# Patient Record
Sex: Male | Born: 1975 | Race: White | Hispanic: No | Marital: Married | Smoking: Current every day smoker
Health system: Southern US, Community
[De-identification: ages and names within clinical notes are randomized; demographics above are authoritative.]

## PROBLEM LIST (undated history)

## (undated) DIAGNOSIS — M519 Unspecified thoracic, thoracolumbar and lumbosacral intervertebral disc disorder: Secondary | ICD-10-CM

## (undated) DIAGNOSIS — M549 Dorsalgia, unspecified: Secondary | ICD-10-CM

## (undated) HISTORY — PX: FINGER SURGERY: SHX640

---

## 2016-09-05 ENCOUNTER — Emergency Department
Admission: EM | Admit: 2016-09-05 | Discharge: 2016-09-05 | Disposition: A | Discharge status: Home / Self Care | Payer: Self-pay | Attending: Emergency Medicine | Admitting: Emergency Medicine

## 2016-09-05 ENCOUNTER — Encounter: Payer: Self-pay | Admitting: Emergency Medicine

## 2016-09-05 DIAGNOSIS — Y939 Activity, unspecified: Secondary | ICD-10-CM | POA: Insufficient documentation

## 2016-09-05 DIAGNOSIS — S39012A Strain of muscle, fascia and tendon of lower back, initial encounter: Secondary | ICD-10-CM | POA: Insufficient documentation

## 2016-09-05 DIAGNOSIS — Y929 Unspecified place or not applicable: Secondary | ICD-10-CM | POA: Insufficient documentation

## 2016-09-05 DIAGNOSIS — Y999 Unspecified external cause status: Secondary | ICD-10-CM | POA: Insufficient documentation

## 2016-09-05 DIAGNOSIS — F17210 Nicotine dependence, cigarettes, uncomplicated: Secondary | ICD-10-CM | POA: Insufficient documentation

## 2016-09-05 DIAGNOSIS — X509XXA Other and unspecified overexertion or strenuous movements or postures, initial encounter: Secondary | ICD-10-CM | POA: Insufficient documentation

## 2016-09-05 HISTORY — DX: Dorsalgia, unspecified: M54.9

## 2016-09-05 MED ORDER — TIZANIDINE HCL 4 MG PO TABS: 4.0000 mg | ORAL_TABLET | Freq: Three times a day (TID) | ORAL | 0 refills | Status: DC

## 2016-09-05 MED ORDER — PREDNISONE 10 MG (21) PO TBPK: ORAL_TABLET | ORAL | 0 refills | Status: DC

## 2016-09-05 MED ORDER — MELOXICAM 15 MG PO TABS: 15.0000 mg | ORAL_TABLET | Freq: Every day | ORAL | 0 refills | Status: DC

## 2016-09-05 MED ORDER — KETOROLAC TROMETHAMINE 60 MG/2ML IM SOLN
60.0000 mg | Freq: Once | INTRAMUSCULAR | Status: AC
  Administered 2016-09-05: 60 mg via INTRAMUSCULAR
  Filled 2016-09-05: qty 2

## 2016-09-05 MED ORDER — ORPHENADRINE CITRATE 30 MG/ML IJ SOLN
60.0000 mg | Freq: Two times a day (BID) | INTRAMUSCULAR | Status: DC
  Administered 2016-09-05: 60 mg via INTRAMUSCULAR
  Filled 2016-09-05: qty 2

## 2016-09-05 NOTE — ED Notes (Signed)
NAD noted at time of D/C. Pt denies questions or concerns. Pt ambulatory to the lobby at this time.  

## 2016-09-05 NOTE — ED Triage Notes (Signed)
Pt reports chronic back pain x 20 years. States today he was pulling a starter today and started having back pain. Pt states he took ibuprofen and hydrocodone 7.5 around 340. Pt takes hydrocodone daily prescribed for chronic back pain.

## 2016-09-05 NOTE — ED Provider Notes (Signed)
[facility] Emergency Department Provider Note ____________________________________________  Time seen: Approximately 7:54 PM  I have reviewed the triage vital signs and the nursing notes.   HISTORY  Chief Complaint Back Pain    HPI Nathaniel AlkenMark Chen is a 41 y.o. male who presents to the emergency department for management of his back pain. He states that he has had chronic back pain for approximately 15 years. He states that today he pulled on a cord to start a 3 wheeler and "aggravated it." He states that he has a similar episode every few years after straining his back doing something "stupid." He took hishydrocodone and also took an ibuprofen without any relief. He states that in the past, when he has had a similar episode he has received some relief with steroids and Toradol. This episode of back pain is identical to previous episodes. No change in location or quality of pain. He denies radiation into the lower extremities also anesthesia or loss of bowel or bladder control.   Past Medical History:  Diagnosis Date  . Back pain     There are no active problems to display for this patient.   History reviewed. No pertinent surgical history.  Prior to Admission medications   Medication Sig Start Date End Date Taking? Authorizing Provider  meloxicam (MOBIC) 15 MG tablet Take 1 tablet (15 mg total) by mouth daily. 09/05/16   Chinita Pesterari B Pasha Gadison, FNP  predniSONE (STERAPRED UNI-PAK 21 TAB) 10 MG (21) TBPK tablet Take 6 tablets on day 1 Take 5 tablets on day 2 Take 4 tablets on day 3 Take 3 tablets on day 4 Take 2 tablets on day 5 Take 1 tablet on day 6 09/05/16   Aki Burdin B Roma Bierlein, FNP  tiZANidine (ZANAFLEX) 4 MG tablet Take 1 tablet (4 mg total) by mouth 3 (three) times daily. 09/05/16   Chinita Pesterari B Rohn Fritsch, FNP    Allergies Patient has no known allergies.  History reviewed. No pertinent family history.  Social History Social History  Substance Use Topics  .  Smoking status: Current Every Day Smoker    Packs/day: 1.00    Types: Cigarettes  . Smokeless tobacco: Never Used  . Alcohol use No    Review of Systems Constitutional: No recent illness. Cardiovascular: Denies chest pain or palpitations. Respiratory: Denies shortness of breath. Musculoskeletal: Pain in Lower back Skin: Negative for rash, wound, lesion. Neurological: Negative for focal weakness or numbness.  ____________________________________________   PHYSICAL EXAM:  VITAL SIGNS: ED Triage Vitals  Enc Vitals Group     BP 09/05/16 1901 112/63     Pulse Rate 09/05/16 1901 91     Resp --      Temp 09/05/16 1901 98.5 F (36.9 C)     Temp Source 09/05/16 1901 Oral     SpO2 09/05/16 1901 96 %     Weight 09/05/16 1902 140 lb (63.5 kg)     Height 09/05/16 1902 5\' 9"  (1.753 m)     Head Circumference --      Peak Flow --      Pain Score 09/05/16 1902 10     Pain Loc --      Pain Edu? --      Excl. in GC? --     Constitutional: Alert and oriented. Well appearing and in no acute distress. Eyes: Conjunctivae are normal. EOMI. Head: Atraumatic. Neck: No stridor.  Respiratory: Normal respiratory effort.   Musculoskeletal: Negative straight leg raise testing. Observed antalgic ambulation with  steady gait. Neurologic:  Normal speech and language. No gross focal neurologic deficits are appreciated. Speech is normal. No gait instability. Skin:  Skin is warm, dry and intact. Atraumatic. Psychiatric: Mood and affect are normal. Speech and behavior are normal.  ____________________________________________   LABS (all labs ordered are listed, but only abnormal results are displayed)  Labs Reviewed - No data to display ____________________________________________  RADIOLOGY  Not indicated ____________________________________________   PROCEDURES  Procedure(s) performed: None   ____________________________________________   INITIAL IMPRESSION / ASSESSMENT AND PLAN /  ED COURSE     Pertinent labs & imaging results that were available during my care of the patient were reviewed by me and considered in my medical decision making (see chart for details).  42 year old male presenting to the emergency department for treatment of acute on chronic low back pain. While in the emergency department tonight, he received injections of Toradol and Norflex with significant relief. He was discharged home with a prescription of meloxicam, Zanaflex, and a tapered dose of prednisone. He was encouraged to follow up with his pain management provider for symptoms that are not improving over the week or call [facility] to see if he can schedule an appointment. He was advised to return to the emergency department for symptoms that change or worsen if he is unable to see pain management or primary care. ____________________________________________   FINAL CLINICAL IMPRESSION(S) / ED DIAGNOSES  Final diagnoses:  Strain of lumbar region, initial encounter       Chinita Pester, FNP 09/05/16 2208    Emily Filbert, MD 09/06/16 [phone]

## 2017-04-03 ENCOUNTER — Encounter: Payer: Self-pay | Admitting: Emergency Medicine

## 2017-04-03 DIAGNOSIS — M5432 Sciatica, left side: Secondary | ICD-10-CM | POA: Insufficient documentation

## 2017-04-03 DIAGNOSIS — F17210 Nicotine dependence, cigarettes, uncomplicated: Secondary | ICD-10-CM | POA: Insufficient documentation

## 2017-04-03 DIAGNOSIS — M5431 Sciatica, right side: Secondary | ICD-10-CM | POA: Insufficient documentation

## 2017-04-03 DIAGNOSIS — Z79899 Other long term (current) drug therapy: Secondary | ICD-10-CM | POA: Insufficient documentation

## 2017-04-03 NOTE — ED Triage Notes (Signed)
Patient states that he has bulging disc in his back and that he has been having pain since Monday. Patient states that he has been trying to rest his back and take IBU but has not had any improvement. Patient states that he normally gets a steroid shot when he has this back pain.

## 2017-04-04 ENCOUNTER — Emergency Department
Admission: EM | Admit: 2017-04-04 | Discharge: 2017-04-04 | Disposition: A | Discharge status: Home / Self Care | Payer: Self-pay | Attending: Emergency Medicine | Admitting: Emergency Medicine

## 2017-04-04 DIAGNOSIS — M5432 Sciatica, left side: Secondary | ICD-10-CM

## 2017-04-04 DIAGNOSIS — M5431 Sciatica, right side: Secondary | ICD-10-CM

## 2017-04-04 MED ORDER — DEXAMETHASONE SODIUM PHOSPHATE 10 MG/ML IJ SOLN
10.0000 mg | Freq: Once | INTRAMUSCULAR | Status: AC
  Administered 2017-04-04: 10 mg via INTRAVENOUS
  Filled 2017-04-04: qty 1

## 2017-04-04 MED ORDER — KETOROLAC TROMETHAMINE 30 MG/ML IJ SOLN
30.0000 mg | Freq: Once | INTRAMUSCULAR | Status: AC
  Administered 2017-04-04: 30 mg via INTRAVENOUS
  Filled 2017-04-04: qty 1

## 2017-04-04 MED ORDER — DIAZEPAM 5 MG PO TABS
5.0000 mg | ORAL_TABLET | Freq: Once | ORAL | Status: AC
  Administered 2017-04-04: 5 mg via ORAL
  Filled 2017-04-04: qty 1

## 2017-04-04 MED ORDER — PREDNISONE 20 MG PO TABS: 60.0000 mg | ORAL_TABLET | Freq: Every day | ORAL | 0 refills | Status: AC

## 2017-04-04 MED ORDER — CYCLOBENZAPRINE HCL 10 MG PO TABS: 10.0000 mg | ORAL_TABLET | Freq: Three times a day (TID) | ORAL | 0 refills | Status: DC | PRN

## 2017-04-04 NOTE — ED Notes (Signed)
MD Brown at bedside.

## 2017-04-04 NOTE — ED Notes (Signed)
Patient reports hx of buldging disc. Patient c/o lower back pain/weakness. Denies new injury.   Patient reports taking 40 mg ibuprofen at approx 1900 yesterday. Pt reports taking 1/2 tablet of 7.5/325 hydrocodone/tylenol.

## 2017-04-04 NOTE — ED Notes (Signed)
Reviewed d/c instructions, follow-up care, prescriptions with patient. Pt verbalized understanding.  

## 2017-04-04 NOTE — ED Provider Notes (Signed)
[facility] Emergency Department Provider Note   First MD Initiated Contact with Patient 04/04/17 0138     (approximate)  I have reviewed the triage vital signs and the nursing notes.   HISTORY  Chief Complaint Back Pain    HPI Nathaniel Chen is a 41 y.o. male with history of "bulging disc in his lower back" presents with low back pain with onset Monday. Patient denies any known injury. The patient states he is usually able to control this with ibuprofen and rest however despite doing so pain has persisted since Monday with current pain score of 9 out of 10. Patient states that pain radiates down his legs. Patient denies any weakness numbness in the lower extremity. Patient denies any urinary or bowel habit changes.   Past Medical History:  Diagnosis Date  . Back pain     There are no active problems to display for this patient.   Past Surgical History:  Procedure Laterality Date  . FINGER SURGERY      Prior to Admission medications   Medication Sig Start Date End Date Taking? Authorizing Provider  meloxicam (MOBIC) 15 MG tablet Take 1 tablet (15 mg total) by mouth daily. 09/05/16   Triplett, Cari B, FNP  predniSONE (STERAPRED UNI-PAK 21 TAB) 10 MG (21) TBPK tablet Take 6 tablets on day 1 Take 5 tablets on day 2 Take 4 tablets on day 3 Take 3 tablets on day 4 Take 2 tablets on day 5 Take 1 tablet on day 6 09/05/16   Triplett, Cari B, FNP  tiZANidine (ZANAFLEX) 4 MG tablet Take 1 tablet (4 mg total) by mouth 3 (three) times daily. 09/05/16   Kem Boroughs B, FNP    Allergies no known drug allergies No family history on file.  Social History Social History  Substance Use Topics  . Smoking status: Current Every Day Smoker    Packs/day: 1.00    Types: Cigarettes  . Smokeless tobacco: Never Used  . Alcohol use No    Review of Systems Constitutional: No fever/chills Eyes: No visual changes. ENT: No sore throat. Cardiovascular: Denies  chest pain. Respiratory: Denies shortness of breath. Gastrointestinal: No abdominal pain.  No nausea, no vomiting.  No diarrhea.  No constipation. Genitourinary: Negative for dysuria. Musculoskeletal: Negative for neck pain.  positive for back pain. Integumentary: Negative for rash. Neurological: Negative for headaches, focal weakness or numbness.  ____________________________________________   PHYSICAL EXAM:  VITAL SIGNS: ED Triage Vitals  Enc Vitals Group     BP 04/03/17 2237 (!) 133/58     Pulse Rate 04/03/17 2237 72     Resp 04/03/17 2237 18     Temp 04/03/17 2237 98 F (36.7 C)     Temp Source 04/03/17 2237 Oral     SpO2 04/03/17 2237 98 %     Weight 04/03/17 2237 63.5 kg (140 lb)     Height 04/03/17 2237 1.727 m ( )     Head Circumference --      Peak Flow --      Pain Score 04/03/17 2236 10     Pain Loc --      Pain Edu? --      Excl. in GC? --     Constitutional: Alert and oriented. apparent discomfort Eyes: Conjunctivae are normal.  Head: Atraumatic. Mouth/Throat: Mucous membranes are moist.  Oropharynx non-erythematous. Neck: No stridor.   Cardiovascular: Normal rate, regular rhythm. Good peripheral circulation. Grossly normal heart sounds. Respiratory: Normal respiratory  effort.  No retractions. Lungs CTAB. Gastrointestinal: Soft and nontender. No distention.  Musculoskeletal: No lower extremity tenderness nor edema. No gross deformities of extremities. Neurologic:  Normal speech and language. No gross focal neurologic deficits are appreciated.  Skin:  Skin is warm, dry and intact. No rash noted. Psychiatric: Mood and affect are normal. Speech and behavior are normal.  ____________________________________________     Procedures   ____________________________________________   INITIAL IMPRESSION / ASSESSMENT AND PLAN / ED COURSE  Pertinent labs & imaging results that were available during my care of the patient were reviewed by me and considered  in my medical decision making (see chart for details).   41 year old male presenting with above stated history and physical exam consistent with sciatica. Patient given Decadron Toradol and Valium with marked improvement of pain. Patient be referred to Dr. Marcell Barlow neurosurgeon for further outpatient evaluation for sciatica.     ____________________________________________  FINAL CLINICAL IMPRESSION(S) / ED DIAGNOSES  Final diagnoses:  Bilateral sciatica     MEDICATIONS GIVEN DURING THIS VISIT:  Medications  dexamethasone (DECADRON) injection 10 mg (not administered)  ketorolac (TORADOL) 30 MG/ML injection 30 mg (not administered)  diazepam (VALIUM) tablet 5 mg (not administered)     NEW OUTPATIENT MEDICATIONS STARTED DURING THIS VISIT:  New Prescriptions   No medications on file    Modified Medications   No medications on file    Discontinued Medications   No medications on file     Note:  This document was prepared using Dragon voice recognition software and may include unintentional dictation errors.    [name], MD 04/04/17 [phone]

## 2017-05-19 ENCOUNTER — Emergency Department
Admission: EM | Admit: 2017-05-19 | Discharge: 2017-05-19 | Disposition: A | Discharge status: Home / Self Care | Payer: Self-pay | Attending: Emergency Medicine | Admitting: Emergency Medicine

## 2017-05-19 ENCOUNTER — Encounter: Payer: Self-pay | Admitting: *Deleted

## 2017-05-19 ENCOUNTER — Emergency Department: Payer: Self-pay

## 2017-05-19 DIAGNOSIS — S335XXA Sprain of ligaments of lumbar spine, initial encounter: Secondary | ICD-10-CM

## 2017-05-19 DIAGNOSIS — X500XXA Overexertion from strenuous movement or load, initial encounter: Secondary | ICD-10-CM | POA: Insufficient documentation

## 2017-05-19 DIAGNOSIS — Z791 Long term (current) use of non-steroidal anti-inflammatories (NSAID): Secondary | ICD-10-CM | POA: Insufficient documentation

## 2017-05-19 DIAGNOSIS — M62830 Muscle spasm of back: Secondary | ICD-10-CM | POA: Insufficient documentation

## 2017-05-19 DIAGNOSIS — Y929 Unspecified place or not applicable: Secondary | ICD-10-CM | POA: Insufficient documentation

## 2017-05-19 DIAGNOSIS — Y999 Unspecified external cause status: Secondary | ICD-10-CM | POA: Insufficient documentation

## 2017-05-19 DIAGNOSIS — Y939 Activity, unspecified: Secondary | ICD-10-CM | POA: Insufficient documentation

## 2017-05-19 DIAGNOSIS — M5431 Sciatica, right side: Secondary | ICD-10-CM | POA: Insufficient documentation

## 2017-05-19 DIAGNOSIS — M5432 Sciatica, left side: Secondary | ICD-10-CM | POA: Insufficient documentation

## 2017-05-19 DIAGNOSIS — S339XXA Sprain of unspecified parts of lumbar spine and pelvis, initial encounter: Secondary | ICD-10-CM | POA: Insufficient documentation

## 2017-05-19 DIAGNOSIS — F17210 Nicotine dependence, cigarettes, uncomplicated: Secondary | ICD-10-CM | POA: Insufficient documentation

## 2017-05-19 DIAGNOSIS — Z79899 Other long term (current) drug therapy: Secondary | ICD-10-CM | POA: Insufficient documentation

## 2017-05-19 MED ORDER — PREDNISONE 10 MG (21) PO TBPK: ORAL_TABLET | ORAL | 0 refills | Status: DC

## 2017-05-19 MED ORDER — ORPHENADRINE CITRATE 30 MG/ML IJ SOLN
60.0000 mg | Freq: Once | INTRAMUSCULAR | Status: AC
  Administered 2017-05-19: 60 mg via INTRAVENOUS
  Filled 2017-05-19: qty 2

## 2017-05-19 MED ORDER — ORPHENADRINE CITRATE ER 100 MG PO TB12: 100.0000 mg | ORAL_TABLET | Freq: Two times a day (BID) | ORAL | 0 refills | Status: DC | PRN

## 2017-05-19 MED ORDER — DEXAMETHASONE SODIUM PHOSPHATE 10 MG/ML IJ SOLN
20.0000 mg | Freq: Once | INTRAMUSCULAR | Status: AC
  Administered 2017-05-19: 20 mg via INTRAVENOUS
  Filled 2017-05-19: qty 2

## 2017-05-19 NOTE — ED Notes (Addendum)
Pt states he has significant back history, slipped disc, ruptured disc was here in August for same thing.Pt states pain is relieved when standing still, but other times it is shooting pain down leg.  Pt is standing at bedside with family member. Awaiting EDP.

## 2017-05-19 NOTE — Discharge Instructions (Signed)
Take medication as prescribed. Return to emergency department if symptoms worsen and follow-up with PCP as needed.   °

## 2017-05-19 NOTE — ED Provider Notes (Signed)
[facility] Emergency Department Provider Note   ____________________________________________   I have reviewed the triage vital signs and the nursing notes.   HISTORY  Chief Complaint Back Pain    HPI Nathaniel Chen is a 41 y.o. male presents to the emergency department with lumbar pain, radiating pain down bilateral lower extremities and lumbar muscle spasms since lifting furniture 1 days ago. Patient describes pain as constant, catching and aching. He denies sensation changes or numbness and tingling of the lower extremities. Patient reports history of "bulging disc in his lower back" with multiple episodes of treatments for sciatica symptoms over the years. Patient reports this episode occurred with less intense aggravating activity which concerns him. He also notes his "legs feel slightly weaker" Patient denies bowl/bladder dysfunction or saddle anesthesia. He also states he never really resolved the last episode when he received treatment ~8 weeks ago. Patient did not follow up with neurosurgery d/t lack of insurance coverage. Patient denies fever, chills, headache, vision changes, chest pain, chest tightness, shortness of breath, abdominal pain, nausea and vomiting.  Past Medical History:  Diagnosis Date  . Back pain     There are no active problems to display for this patient.   Past Surgical History:  Procedure Laterality Date  . FINGER SURGERY      Prior to Admission medications   Medication Sig Start Date End Date Taking? Authorizing Provider  cyclobenzaprine (FLEXERIL) 10 MG tablet Take 1 tablet (10 mg total) by mouth 3 (three) times daily as needed. 04/04/17   Darci CurrentBrown, [place] N, MD  meloxicam (MOBIC) 15 MG tablet Take 1 tablet (15 mg total) by mouth daily. 09/05/16   Triplett, Rulon Eisenmengerari B, FNP  orphenadrine (NORFLEX) 100 MG tablet Take 1 tablet (100 mg total) 2 (two) times daily as needed by mouth for muscle spasms. 05/19/17 05/19/18  Tamantha Saline M,  PA-C  predniSONE (STERAPRED UNI-PAK 21 TAB) 10 MG (21) TBPK tablet Take 6 tablets on day 1. Take 5 tablets on day 2. Take 4 tablets on day 3. Take 3 tablets on day 4. Take 2 tablets on day 5. Take 1 tablets on day 6. 05/19/17   Shawan Corella M, PA-C  tiZANidine (ZANAFLEX) 4 MG tablet Take 1 tablet (4 mg total) by mouth 3 (three) times daily. 09/05/16   Chinita Pesterriplett, Cari B, FNP    Allergies Patient has no known allergies.  History reviewed. No pertinent family history.  Social History Social History   Tobacco Use  . Smoking status: Current Every Day Smoker    Packs/day: 1.00    Types: Cigarettes  . Smokeless tobacco: Never Used  Substance Use Topics  . Alcohol use: No  . Drug use: No    Review of Systems Constitutional: No fever/chills Eyes: No visual changes. Cardiovascular: Denies chest pain. Respiratory: Denies shortness of breath. Gastrointestinal: No abdominal pain.  No nausea, no vomiting.  No diarrhea.  No constipation. Genitourinary: Negative for dysuria. Musculoskeletal: Positive for back pain and radicular pain.  Integumentary: Negative for rash. Neurological: Negative for headaches, focal weakness or numbness. ____________________________________________   PHYSICAL EXAM:  VITAL SIGNS: ED Triage Vitals  Enc Vitals Group     BP 05/19/17 1631 109/68     Pulse Rate 05/19/17 1631 100     Resp 05/19/17 1631 18     Temp 05/19/17 1631 98.2 F (36.8 C)     Temp Source 05/19/17 1631 Oral     SpO2 05/19/17 1631 99 %  Weight 05/19/17 1631 145 lb (65.8 kg)     Height 05/19/17 1631 5\' 8"  (1.727 m)     Head Circumference --      Peak Flow --      Pain Score 05/19/17 1640 10     Pain Loc --      Pain Edu? --      Excl. in GC? --     Constitutional: Alert and oriented. apparent discomfort Eyes: Conjunctivae are normal.  Head: Atraumatic. Mouth/Throat: Mucous membranes are moist.  Neck: No stridor.   Cardiovascular: Normal rate, regular rhythm. Good peripheral  circulation. Grossly normal heart sounds. Respiratory: Normal respiratory effort.  No retractions. Lungs CTAB. Gastrointestinal: Soft and nontender. No distention.  Musculoskeletal: Lumbar spine ROM intact without spinous process tenderness or deformity. Lumbar parasinal and gluteal muscle tenderness with spasms. (+) slump test bilaterally. Intact ROM, strength and sensation of the bilateral lower extremities.  Neurologic:  Normal speech and language. No gross focal neurologic deficits are appreciated.  Skin:  Skin is warm, dry and intact. No rash noted. Psychiatric: Mood and affect are normal. Speech and behavior are normal. ____________________________________________   LABS (all labs ordered are listed, but only abnormal results are displayed)  Labs Reviewed - No data to display ____________________________________________  EKG none ____________________________________________  RADIOLOGY DG lumbar complete FINDINGS: Right greater than left degenerative facet arthropathy at L5-S1. 3 mm degenerative retrolisthesis at L5-S1. No fracture or subluxation identified.  IMPRESSION: 1. 3 mm degenerative retrolisthesis at L5-S1 associated with mild degenerative facet arthropathy. ____________________________________________   PROCEDURES  Procedure(s) performed: no    Critical Care performed: no ____________________________________________   INITIAL IMPRESSION / ASSESSMENT AND PLAN / ED COURSE  Pertinent labs & imaging results that were available during my care of the patient were reviewed by me and considered in my medical decision making (see chart for details).  Patient presents to emergency department with lumbar back pain after lifting furniture earlier today. History, physical exam findings and imaging are reassuring symptoms are consistent with lumbar strain with exacerbation of sciatica. Patient noted improvement of symptoms following decadron and norflex given during  the course of care in the emergency department. Patient will be prescribed prednisone taper and norflex as needed for muscle spasms. Patient advised to follow up with PCP as needed or return to the emergency department if symptoms return or worsen. Patient informed of clinical course, understand medical decision-making process, and agree with plan. Patient informed of clinical course, understand medical decision-making process, and agree with plan.  ____________________________________________   FINAL CLINICAL IMPRESSION(S) / ED DIAGNOSES  Final diagnoses:  Lumbar back sprain, initial encounter  Bilateral sciatica  Muscle spasm of back       NEW MEDICATIONS STARTED DURING THIS VISIT:  This SmartLink is deprecated. Use AVSMEDLIST instead to display the medication list for a patient.   Note:  This document was prepared using Dragon voice recognition software and may include unintentional dictation errors.    Clois ComberLittle, Locke Barrell M, PA-C 05/19/17 1839    Minna AntisPaduchowski, Kevin, MD 05/20/17 1339

## 2017-05-19 NOTE — ED Triage Notes (Signed)
States he was moving furniture today and states his chronic back pain is flaring up

## 2017-05-26 ENCOUNTER — Encounter: Payer: Self-pay | Admitting: Intensive Care

## 2017-05-26 ENCOUNTER — Emergency Department
Admission: EM | Admit: 2017-05-26 | Discharge: 2017-05-26 | Disposition: A | Discharge status: Home / Self Care | Payer: Medicaid Other | Attending: Emergency Medicine | Admitting: Emergency Medicine

## 2017-05-26 ENCOUNTER — Emergency Department: Payer: Medicaid Other

## 2017-05-26 DIAGNOSIS — M545 Low back pain: Secondary | ICD-10-CM | POA: Diagnosis present

## 2017-05-26 DIAGNOSIS — M5442 Lumbago with sciatica, left side: Secondary | ICD-10-CM | POA: Insufficient documentation

## 2017-05-26 DIAGNOSIS — Z79899 Other long term (current) drug therapy: Secondary | ICD-10-CM | POA: Diagnosis not present

## 2017-05-26 DIAGNOSIS — F17210 Nicotine dependence, cigarettes, uncomplicated: Secondary | ICD-10-CM | POA: Insufficient documentation

## 2017-05-26 DIAGNOSIS — M5441 Lumbago with sciatica, right side: Secondary | ICD-10-CM | POA: Insufficient documentation

## 2017-05-26 HISTORY — DX: Unspecified thoracic, thoracolumbar and lumbosacral intervertebral disc disorder: M51.9

## 2017-05-26 MED ORDER — METHYLPREDNISOLONE SODIUM SUCC 125 MG IJ SOLR
125.0000 mg | Freq: Once | INTRAMUSCULAR | Status: AC
  Administered 2017-05-26: 125 mg via INTRAVENOUS
  Filled 2017-05-26: qty 2

## 2017-05-26 MED ORDER — PREDNISONE 50 MG PO TABS: ORAL_TABLET | ORAL | 0 refills | Status: DC

## 2017-05-26 NOTE — ED Notes (Signed)
Patient presents to the ED with chronic lower back pain.  Patient states he often has flare ups that are usually relieved with rest and medication but states this last one has been going on for two weeks without improvement and are interfering with patient's job.

## 2017-05-26 NOTE — ED Notes (Signed)
Patient taken to MRI

## 2017-05-26 NOTE — ED Provider Notes (Signed)
[facility] Emergency Department Provider Note  ____________________________________________  Time seen: Approximately 4:45 PM  I have reviewed the triage vital signs and the nursing notes.   HISTORY  Chief Complaint Back Pain    HPI Nathaniel Chen is a 41 y.o. male presents to the emergency department with low back pain with bilateral lower extremity radiculopathy and new onset weakness.  Patient was seen at [facility] on 05/19/2017 and given Decadron with prednisone prescribed at discharge.  Patient reports no significant improvement in his symptoms.  Patient is concerned about leg weakness.  He denies saddle anesthesia or incontinence.  He denies falls or instances of trauma.  Patient is seeking MRI.  Patient reports that he is unable to seek care with a neurosurgeon at this time due to a lack of insurance.  He denies chest pain, chest tightness, shortness of breath, nausea, vomiting abdominal pain.   Past Medical History:  Diagnosis Date  . Back pain   . Ruptured disk     There are no active problems to display for this patient.   Past Surgical History:  Procedure Laterality Date  . FINGER SURGERY      Prior to Admission medications   Medication Sig Start Date End Date Taking? Authorizing Provider  cyclobenzaprine (FLEXERIL) 10 MG tablet Take 1 tablet (10 mg total) by mouth 3 (three) times daily as needed. 04/04/17   [name], MD  meloxicam (MOBIC) 15 MG tablet Take 1 tablet (15 mg total) by mouth daily. 09/05/16   Triplett, Rulon Eisenmenger B, FNP  orphenadrine (NORFLEX) 100 MG tablet Take 1 tablet (100 mg total) 2 (two) times daily as needed by mouth for muscle spasms. 05/19/17 05/19/18  Little, Traci M, PA-C  predniSONE (DELTASONE) 50 MG tablet Take one tablet daily for the next five days. 05/26/17   Orvil Feil, PA-C  tiZANidine (ZANAFLEX) 4 MG tablet Take 1 tablet (4 mg total) by mouth 3 (three) times daily. 09/05/16   Chinita Pester, FNP    Allergies Patient has no known allergies.  History reviewed. No pertinent family history.  Social History Social History   Tobacco Use  . Smoking status: Current Every Day Smoker    Packs/day: 1.00    Types: Cigarettes  . Smokeless tobacco: Never Used  Substance Use Topics  . Alcohol use: No  . Drug use: No     Review of Systems  Constitutional: No fever/chills Eyes: No visual changes. No discharge ENT: No upper respiratory complaints. Cardiovascular: no chest pain. Respiratory: no cough. No SOB. Gastrointestinal: No abdominal pain.  No nausea, no vomiting.  No diarrhea.  No constipation. Genitourinary: Negative for dysuria. No hematuria Musculoskeletal: Patient has low back pain Skin: Negative for rash, abrasions, lacerations, ecchymosis. Neurological: Negative for headaches, focal weakness or numbness.   ____________________________________________   PHYSICAL EXAM:  VITAL SIGNS: ED Triage Vitals  Enc Vitals Group     BP 05/26/17 1621 131/69     Pulse Rate 05/26/17 1621 83     Resp 05/26/17 1621 16     Temp 05/26/17 1621 (!) 97.5 F (36.4 C)     Temp Source 05/26/17 1621 Oral     SpO2 05/26/17 1621 95 %     Weight 05/26/17 1620 145 lb (65.8 kg)     Height 05/26/17 1620 5\' 8"  (1.727 m)     Head Circumference --      Peak Flow --      Pain Score 05/26/17  1620 10     Pain Loc --      Pain Edu? --      Excl. in GC? --      Constitutional: Patient is standing. Eyes: Conjunctivae are normal. PERRL. EOMI. Head: Atraumatic. Cardiovascular: Normal rate, regular rhythm. Normal S1 and S2.  Good peripheral circulation. Respiratory: Normal respiratory effort without tachypnea or retractions. Lungs CTAB. Good air entry to the bases with no decreased or absent breath sounds. Musculoskeletal: Patient has positive straight leg raise test bilaterally.  Patient's left leg is shaking with straight leg raise test.  Paraspinal muscle tenderness elicited  with palpation of lumbar paraspinal muscles.  Palpable dorsalis pedis pulse bilaterally and symmetrically. Neurologic:  Normal speech and language. No gross focal neurologic deficits are appreciated.  Skin:  Skin is warm, dry and intact. No rash noted. Psychiatric: Mood and affect are normal. Speech and behavior are normal. Patient exhibits appropriate insight and judgement.   ____________________________________________   LABS (all labs ordered are listed, but only abnormal results are displayed)  Labs Reviewed - No data to display ____________________________________________  EKG   ____________________________________________  RADIOLOGY Geraldo PitterI, Jaclyn M Woods, personally viewed and evaluated these images  as part of my medical decision making, as well as reviewing the written report by the radiologist.    Mr Lumbar Spine Wo Contrast  Result Date: 05/26/2017 CLINICAL DATA:  Low back and bilateral leg pain 1 week after lifting injury. EXAM: MRI LUMBAR SPINE WITHOUT CONTRAST TECHNIQUE: Multiplanar, multisequence MR imaging of the lumbar spine was performed. No intravenous contrast was administered. COMPARISON:  Lumbar spine radiographs 05/19/2017 FINDINGS: Segmentation: Conventional anatomy assumed, with the last open disc space designated L5-S1. Alignment:  Normal. Vertebrae: No worrisome osseous lesion, acute fracture or pars defect. The visualized sacroiliac joints appear unremarkable. Conus medullaris: Extends to the T12 level and is not well visualized. Paraspinal and other soft tissues: No significant paraspinal findings. Disc levels: No significant disc space findings from T12-L1 through L2-3. L3-4: Mild disc desiccation and bulging asymmetric laterally to the left. Mild bilateral facet hypertrophy. There is minimal narrowing of the left lateral recess. The foramina are patent. L4-5: Mild loss of disc height with annular disc bulging. Mild facet and ligamentous hypertrophy. There is mild  narrowing of the lateral recesses. The foramina are patent. L5-S1: Disc height and hydration are maintained. There is a shallow left paracentral disc protrusion without resulting nerve root encroachment. The foramina are patent. IMPRESSION: 1. No high-grade spinal stenosis or definite nerve root encroachment. 2. Mild disc bulging eccentric to the left at L3-4 with resulting mild narrowing of the left lateral recess. 3. Mild narrowing of both lateral recesses at L4-5 due to disc degeneration and annular bulging. 4. Shallow left paracentral disc protrusion at L5-S1 without nerve root encroachment. Electronically Signed   By: Carey BullocksWilliam  Veazey M.D.   On: 05/26/2017 19:09    ____________________________________________    PROCEDURES  Procedure(s) performed:    Procedures    Medications  methylPREDNISolone sodium succinate (SOLU-MEDROL) 125 mg/2 mL injection 125 mg (125 mg Intravenous Given 05/26/17 1948)     ____________________________________________   INITIAL IMPRESSION / ASSESSMENT AND PLAN / ED COURSE  Pertinent labs & imaging results that were available during my care of the patient were reviewed by me and considered in my medical decision making (see chart for details).  Review of the [place] CSRS was performed in accordance of the [facility] prior to dispensing any controlled drugs.     Assessment and plan  Low back pain Differential diagnosis includes cauda equina versus herniated disc versus lumbar spinal stenosis versus sciatica.  Due to patient complaints of weakness that has become progressive, an MRI in the emergency department was warranted.  MRI revealed mild disc bulging.  Patient was discharged with prednisone.  I feel that tapered prednisone that patient previously completed was not at a high enough therapeutic dose for multiple days.  Patient was advised to follow-up with Dr. Marcell BarlowYarborough.  Vital signs are reassuring prior to discharge.  All patient questions were  answered.   ____________________________________________  FINAL CLINICAL IMPRESSION(S) / ED DIAGNOSES  Final diagnoses:  Acute bilateral low back pain with bilateral sciatica      NEW MEDICATIONS STARTED DURING THIS VISIT:  ED Discharge Orders        Ordered    predniSONE (DELTASONE) 50 MG tablet     05/26/17 1933          This chart was dictated using voice recognition software/Dragon. Despite best efforts to proofread, errors can occur which can change the meaning. Any change was purely unintentional.    Orvil FeilWoods, Jaclyn M, PA-C 05/26/17 1956    Myrna BlazerSchaevitz, David Matthew, MD 05/26/17 [phone]

## 2017-05-26 NOTE — ED Triage Notes (Signed)
Patient states "I was here last week and diagnosed with ruptured disc. I irritated it awhile back and still haven't been able to work. It is an 40 year old injury" Denies recent injury. Patient able to ambulate with no problems

## 2017-05-26 NOTE — ED Notes (Signed)
Provider in room to assess patient.  Will continue to monitor.   

## 2017-06-05 ENCOUNTER — Other Ambulatory Visit: Payer: Self-pay

## 2017-06-05 ENCOUNTER — Emergency Department
Admission: EM | Admit: 2017-06-05 | Discharge: 2017-06-05 | Disposition: A | Discharge status: Home / Self Care | Payer: Medicaid Other | Attending: Emergency Medicine | Admitting: Emergency Medicine

## 2017-06-05 ENCOUNTER — Encounter: Payer: Self-pay | Admitting: Emergency Medicine

## 2017-06-05 DIAGNOSIS — M549 Dorsalgia, unspecified: Secondary | ICD-10-CM | POA: Diagnosis present

## 2017-06-05 DIAGNOSIS — Z79899 Other long term (current) drug therapy: Secondary | ICD-10-CM | POA: Diagnosis not present

## 2017-06-05 DIAGNOSIS — M5442 Lumbago with sciatica, left side: Secondary | ICD-10-CM | POA: Insufficient documentation

## 2017-06-05 DIAGNOSIS — F17210 Nicotine dependence, cigarettes, uncomplicated: Secondary | ICD-10-CM | POA: Diagnosis not present

## 2017-06-05 DIAGNOSIS — M5441 Lumbago with sciatica, right side: Secondary | ICD-10-CM | POA: Insufficient documentation

## 2017-06-05 MED ORDER — KETOROLAC TROMETHAMINE 10 MG PO TABS
10.0000 mg | ORAL_TABLET | Freq: Four times a day (QID) | ORAL | 0 refills | Status: AC | PRN
Start: 2017-06-05 — End: 2017-06-10

## 2017-06-05 MED ORDER — KETOROLAC TROMETHAMINE 30 MG/ML IJ SOLN
30.0000 mg | Freq: Once | INTRAMUSCULAR | Status: AC
  Administered 2017-06-05: 30 mg via INTRAMUSCULAR
  Filled 2017-06-05: qty 1

## 2017-06-05 NOTE — ED Provider Notes (Signed)
[facility] Emergency Department Provider Note  ____________________________________________  Time seen: Approximately 5:30 PM  I have reviewed the triage vital signs and the nursing notes.   HISTORY  Chief Complaint Back Pain    HPI Nathaniel Chen is a 41 y.o. male presenting to the emergency department with an exacerbation of sciatica.  Patient was seen on 05/26/2017 and had an MRI conducted.  Patient was placed on prednisone during aforementioned encounter.  Patient reported that his sciatica improved significantly and he was able to return to work.  Patient reports that he went on a 6-hour car trip and sciatica flared back up.  Patient has a follow-up appointment with with Dr. Marcell BarlowYarborough scheduled for Tuesday, June 08, 2017.  Patient denies chest pain, chest tightness, nausea, vomiting or abdominal pain   Past Medical History:  Diagnosis Date  . Back pain   . Ruptured disk     There are no active problems to display for this patient.   Past Surgical History:  Procedure Laterality Date  . FINGER SURGERY      Prior to Admission medications   Medication Sig Start Date End Date Taking? Authorizing Provider  cyclobenzaprine (FLEXERIL) 10 MG tablet Take 1 tablet (10 mg total) by mouth 3 (three) times daily as needed. 04/04/17   Darci CurrentBrown, [place] N, MD  ketorolac (TORADOL) 10 MG tablet Take 1 tablet (10 mg total) by mouth every 6 (six) hours as needed for up to 5 days. 06/05/17 06/10/17  Orvil FeilWoods, Kenzo Ozment M, PA-C  meloxicam (MOBIC) 15 MG tablet Take 1 tablet (15 mg total) by mouth daily. 09/05/16   Triplett, Rulon Eisenmengerari B, FNP  orphenadrine (NORFLEX) 100 MG tablet Take 1 tablet (100 mg total) 2 (two) times daily as needed by mouth for muscle spasms. 05/19/17 05/19/18  Little, Traci M, PA-C  predniSONE (DELTASONE) 50 MG tablet Take one tablet daily for the next five days. 05/26/17   Orvil FeilWoods, Gertrude Tarbet M, PA-C  tiZANidine (ZANAFLEX) 4 MG tablet Take 1 tablet (4 mg total) by  mouth 3 (three) times daily. 09/05/16   Chinita Pesterriplett, Cari B, FNP    Allergies Patient has no known allergies.  History reviewed. No pertinent family history.  Social History Social History   Tobacco Use  . Smoking status: Current Every Day Smoker    Packs/day: 1.00    Types: Cigarettes  . Smokeless tobacco: Never Used  Substance Use Topics  . Alcohol use: No  . Drug use: No     Review of Systems  Constitutional: No fever/chills Eyes: No visual changes. No discharge ENT: No upper respiratory complaints. Cardiovascular: no chest pain. Respiratory: no cough. No SOB. Gastrointestinal: No abdominal pain.  No nausea, no vomiting.  No diarrhea.  No constipation. Genitourinary: Negative for dysuria. No hematuria Musculoskeletal: Patient has low back pain with radiculopathy. Skin: Negative for rash, abrasions, lacerations, ecchymosis. Neurological: Negative for headaches, focal weakness or numbness.  ____________________________________________   PHYSICAL EXAM:  VITAL SIGNS: ED Triage Vitals  Enc Vitals Group     BP 06/05/17 1614 118/66     Pulse Rate 06/05/17 1614 91     Resp --      Temp 06/05/17 1614 98 F (36.7 C)     Temp Source 06/05/17 1614 Oral     SpO2 06/05/17 1614 99 %     Weight 06/05/17 1618 145 lb (65.8 kg)     Height 06/05/17 1618 5\' 8"  (1.727 m)     Head Circumference --  Peak Flow --      Pain Score 06/05/17 1618 10     Pain Loc --      Pain Edu? --      Excl. in GC? --      Constitutional: Alert and oriented. Well appearing and in no acute distress. Eyes: Conjunctivae are normal. PERRL. EOMI. Head: Atraumatic. Cardiovascular: Normal rate, regular rhythm. Normal S1 and S2.  Good peripheral circulation. Respiratory: Normal respiratory effort without tachypnea or retractions. Lungs CTAB. Good air entry to the bases with no decreased or absent breath sounds. Gastrointestinal: Bowel sounds 4 quadrants. Soft and nontender to palpation. No guarding  or rigidity. No palpable masses. No distention. No CVA tenderness. Musculoskeletal: No midline spinal tenderness. Positive straight leg raise bilaterally.  Neurologic:  Normal speech and language. No gross focal neurologic deficits are appreciated.  Skin:  Skin is warm, dry and intact. No rash noted. Psychiatric: Mood and affect are normal. Speech and behavior are normal. Patient exhibits appropriate insight and judgement.   ____________________________________________   LABS (all labs ordered are listed, but only abnormal results are displayed)  Labs Reviewed - No data to display ____________________________________________  EKG   ____________________________________________  RADIOLOGY   No results found.  ____________________________________________    PROCEDURES  Procedure(s) performed:    Procedures    Medications  ketorolac (TORADOL) 30 MG/ML injection 30 mg (30 mg Intramuscular Given 06/05/17 1730)     ____________________________________________   INITIAL IMPRESSION / ASSESSMENT AND PLAN / ED COURSE  Pertinent labs & imaging results that were available during my care of the patient were reviewed by me and considered in my medical decision making (see chart for details).  Review of the [place] CSRS was performed in accordance of the [facility] prior to dispensing any controlled drugs.     Assessment and Plan: Low back pain with bilateral lower extremity radiculopathy Patient presents to the emergency department with chronic low back pain with bilateral lower extremity radiculopathy.  Patient requested to be restarted on steroids.  However, patient has recently finished 2 courses of steroids.  Patient education was provided regarding potential adverse effects of chronic steroid use.  Patient was given Toradol in the emergency department.  He was discharged with Toradol and advised to keep appointment with neurosurgery this week.  Vital signs are reassuring prior to  discharge.  All patient questions were answered.    ____________________________________________  FINAL CLINICAL IMPRESSION(S) / ED DIAGNOSES  Final diagnoses:  Acute bilateral low back pain with bilateral sciatica      NEW MEDICATIONS STARTED DURING THIS VISIT:  ED Discharge Orders        Ordered    ketorolac (TORADOL) 10 MG tablet  Every 6 hours PRN     06/05/17 1727          This chart was dictated using voice recognition software/Dragon. Despite best efforts to proofread, errors can occur which can change the meaning. Any change was purely unintentional.    Orvil FeilWoods, Abelina Ketron M, PA-C 06/05/17 1737    Phineas SemenGoodman, [place], MD 06/05/17 [id number]

## 2017-06-05 NOTE — ED Notes (Signed)
Pt states that he has been seen here 2 other times in the past few weeks for the same back pain. Pt states he was in a car for a long period of time and that he feels that has irritated his back again.  Pt states that the steroids seem to help his pain the most.  Pt states he has an appt with Dr. Marcell BarlowYarborough on Tuesday.

## 2017-06-05 NOTE — ED Triage Notes (Signed)
Pt reports chronic back pain, states he has bulging disc and states pain has been aggravated for the past 8 weeks. States the steroid shots seem to help a lot.  Pt states he sees a provider in [place] not for back pain though.  Pt has appt for Tuesday for back pain. Pt states last time he was here he had MRI.

## 2017-07-02 ENCOUNTER — Ambulatory Visit (INDEPENDENT_AMBULATORY_CARE_PROVIDER_SITE_OTHER): Payer: Medicaid Other | Admitting: Family Medicine

## 2017-07-02 ENCOUNTER — Encounter: Payer: Self-pay | Admitting: Family Medicine

## 2017-07-02 VITALS — BP 130/91 | HR 91 | Temp 97.7°F | Resp 16 | Ht 68.0 in | Wt 142.6 lb

## 2017-07-02 DIAGNOSIS — M545 Low back pain, unspecified: Secondary | ICD-10-CM | POA: Insufficient documentation

## 2017-07-02 DIAGNOSIS — Z7689 Persons encountering health services in other specified circumstances: Secondary | ICD-10-CM

## 2017-07-02 DIAGNOSIS — G8929 Other chronic pain: Secondary | ICD-10-CM | POA: Diagnosis not present

## 2017-07-02 NOTE — Progress Notes (Signed)
BP (!) 130/91 (BP Location: Right Arm, Patient Position: Sitting)   Pulse 91   Temp 97.7 F (36.5 C) (Oral)   Resp 16   Ht 5\' 8"  (1.727 m)   Wt 142 lb 9.6 oz (64.7 kg)   SpO2 99%   BMI 21.68 kg/m    Subjective:    Patient ID: Nathaniel Chen, male    DOB: 02/16/1976, 41 y.o.   MRN: [id number]  HPI: Nathaniel Chen is a 41 y.o. male  Chief Complaint  Patient presents with  . Establish Care  . Back Pain   Patient here today to establish care. Last CPE was about a year ago with normal results.   No known medical problems other than significant low back issues. Takes norflex ususally at bedtime, does not like the way meloxicam made him feel so just takes ibuprofen prn. Takes 1-2 hydrocodone daily 7.5- 325 as needed for pain. Had a ruptured disc at 41 years old, and recent MRI in ER showed multiple other disc issus and spinal stenosis. Gets severe flares several times yearly. Hasn't had much sciatica lately but having back spasms that have become quite bothersome. Taking norflex prn.   Relevant past medical, surgical, family and social history reviewed and updated as indicated. Interim medical history since our last visit reviewed. Allergies and medications reviewed and updated.  Review of Systems  Constitutional: Negative.   HENT: Negative.   Respiratory: Negative.   Cardiovascular: Negative.   Gastrointestinal: Negative.   Genitourinary: Negative.   Musculoskeletal: Positive for back pain and myalgias.  Neurological: Negative.   Psychiatric/Behavioral: Negative.    Per HPI unless specifically indicated above     Objective:    BP (!) 130/91 (BP Location: Right Arm, Patient Position: Sitting)   Pulse 91   Temp 97.7 F (36.5 C) (Oral)   Resp 16   Ht 5\' 8"  (1.727 m)   Wt 142 lb 9.6 oz (64.7 kg)   SpO2 99%   BMI 21.68 kg/m   Wt Readings from Last 3 Encounters:  07/02/17 142 lb 9.6 oz (64.7 kg)  06/05/17 145 lb (65.8 kg)  05/26/17 145 lb (65.8 kg)    Physical Exam    Constitutional: He is oriented to person, place, and time. He appears well-developed and well-nourished. No distress.  HENT:  Head: Atraumatic.  Eyes: Conjunctivae are normal. Pupils are equal, round, and reactive to light. No scleral icterus.  Neck: Normal range of motion. Neck supple.  Cardiovascular: Normal rate, normal heart sounds and intact distal pulses.  Pulmonary/Chest: Effort normal and breath sounds normal. No respiratory distress.  Musculoskeletal: Normal range of motion. He exhibits tenderness (midline ttp lumbar spine).  Lymphadenopathy:    He has no cervical adenopathy.  Neurological: He is alert and oriented to person, place, and time. No cranial nerve deficit.  Strength full and equal b/l UE/LE  Skin: Skin is warm and dry.  Psychiatric: He has a normal mood and affect. His behavior is normal.  Nursing note and vitals reviewed.  No results found for this or any previous visit.    Assessment & Plan:   Problem List Items Addressed This Visit      Other   Chronic low back pain - Primary    Recently got insurance, wanting to move forward with either injections or surgical intervention for his long hx of back issues as things are worsening. Will refer to Neurosurgery for further evaluation. Continue current regimen in meantime      Relevant  Medications   HYDROcodone-acetaminophen (NORCO) 7.5-325 MG tablet   ibuprofen (ADVIL,MOTRIN) 800 MG tablet   Other Relevant Orders   Ambulatory referral to Neurosurgery    Other Visit Diagnoses    Encounter to establish care           Follow up plan: Return for CPE.

## 2017-07-05 ENCOUNTER — Encounter: Payer: Self-pay | Admitting: Family Medicine

## 2017-07-05 NOTE — Assessment & Plan Note (Signed)
Recently got insurance, wanting to move forward with either injections or surgical intervention for his long hx of back issues as things are worsening. Will refer to Neurosurgery for further evaluation. Continue current regimen in meantime

## 2017-07-05 NOTE — Patient Instructions (Signed)
Follow up for CPE 

## 2017-08-04 ENCOUNTER — Telehealth: Payer: Self-pay

## 2017-08-04 DIAGNOSIS — G8929 Other chronic pain: Secondary | ICD-10-CM

## 2017-08-04 DIAGNOSIS — M545 Low back pain: Principal | ICD-10-CM

## 2017-08-04 NOTE — Telephone Encounter (Signed)
Copied from CRM [phone]. Topic: Referral - Request >> Aug 04, 2017  2:54 PM Alexander BergeronBarksdale, Harvey B wrote: Reason for CRM: pt called to state that he has been approved for medicaid and that he is needing a referral to a pain clinic in [place] to get a steroid shot in his back, contact pt if needed to advise

## 2017-08-04 NOTE — Telephone Encounter (Signed)
Fleet ContrasRachel could you please enter in referral for patient.

## 2017-08-05 NOTE — Telephone Encounter (Signed)
Referral generated

## 2017-08-13 ENCOUNTER — Telehealth: Payer: Self-pay | Admitting: Family Medicine

## 2017-08-13 NOTE — Telephone Encounter (Signed)
Voicemail left for [place] [facility].

## 2017-08-13 NOTE — Telephone Encounter (Signed)
Referral was placed by me 1/24  Copied from CRM [phone]. Topic: Referral - Request >> Aug 12, 2017  2:35 PM Landry MellowFoltz, Melissa J wrote: Reason for CRM: pain clicnic at [facility] called- pt was referred by [facility] . Pain center needs referral from [facility] since that is pcp.  Phone is [phone] Fax [phone]  >> Aug 12, 2017  3:49 PM Richarda OverlieFox, Jada A, CMA wrote: Please advise.

## 2017-08-23 ENCOUNTER — Encounter: Payer: Self-pay | Admitting: Student in an Organized Health Care Education/Training Program

## 2017-08-23 ENCOUNTER — Ambulatory Visit
Admission: RE | Admit: 2017-08-23 | Discharge: 2017-08-23 | Disposition: A | Discharge status: Home / Self Care | Payer: Medicaid Other | Source: Ambulatory Visit | Attending: Student in an Organized Health Care Education/Training Program | Admitting: Student in an Organized Health Care Education/Training Program

## 2017-08-23 ENCOUNTER — Other Ambulatory Visit: Payer: Self-pay

## 2017-08-23 ENCOUNTER — Ambulatory Visit (HOSPITAL_BASED_OUTPATIENT_CLINIC_OR_DEPARTMENT_OTHER): Payer: Medicaid Other | Admitting: Student in an Organized Health Care Education/Training Program

## 2017-08-23 VITALS — BP 116/69 | HR 68 | Temp 97.6°F | Resp 16 | Ht 68.0 in | Wt 145.0 lb

## 2017-08-23 DIAGNOSIS — M5416 Radiculopathy, lumbar region: Secondary | ICD-10-CM

## 2017-08-23 DIAGNOSIS — M5116 Intervertebral disc disorders with radiculopathy, lumbar region: Secondary | ICD-10-CM

## 2017-08-23 DIAGNOSIS — M5136 Other intervertebral disc degeneration, lumbar region: Secondary | ICD-10-CM

## 2017-08-23 DIAGNOSIS — M51369 Other intervertebral disc degeneration, lumbar region without mention of lumbar back pain or lower extremity pain: Secondary | ICD-10-CM

## 2017-08-23 DIAGNOSIS — G894 Chronic pain syndrome: Secondary | ICD-10-CM | POA: Diagnosis not present

## 2017-08-23 MED ORDER — DEXAMETHASONE SODIUM PHOSPHATE 10 MG/ML IJ SOLN
10.0000 mg | Freq: Once | INTRAMUSCULAR | Status: AC
  Administered 2017-08-23: 10 mg
  Filled 2017-08-23: qty 1

## 2017-08-23 MED ORDER — SODIUM CHLORIDE 0.9% FLUSH
2.0000 mL | Freq: Once | INTRAVENOUS | Status: AC
  Administered 2017-08-23: 2 mL

## 2017-08-23 MED ORDER — LIDOCAINE HCL (PF) 1 % IJ SOLN
4.5000 mL | Freq: Once | INTRAMUSCULAR | Status: AC
  Administered 2017-08-23: 4.5 mL
  Filled 2017-08-23: qty 5

## 2017-08-23 MED ORDER — IOPAMIDOL (ISOVUE-M 200) INJECTION 41%
10.0000 mL | Freq: Once | INTRAMUSCULAR | Status: AC
  Administered 2017-08-23: 10 mL via EPIDURAL
  Filled 2017-08-23: qty 10

## 2017-08-23 MED ORDER — LACTATED RINGERS IV SOLN
1000.0000 mL | Freq: Once | INTRAVENOUS | Status: AC
  Administered 2017-08-23: 1000 mL via INTRAVENOUS

## 2017-08-23 MED ORDER — ROPIVACAINE HCL 2 MG/ML IJ SOLN
2.0000 mL | Freq: Once | INTRAMUSCULAR | Status: AC
  Administered 2017-08-23: 2 mL via EPIDURAL
  Filled 2017-08-23: qty 10

## 2017-08-23 MED ORDER — MIDAZOLAM HCL 5 MG/5ML IJ SOLN
1.0000 mg | INTRAMUSCULAR | Status: DC | PRN
  Administered 2017-08-23: 1 mg via INTRAVENOUS
  Filled 2017-08-23: qty 5

## 2017-08-23 NOTE — Progress Notes (Signed)
Patient's Name: Nathaniel Chen  MRN: [id number]  Referring Provider: Ivar DrapeFerri, Amanda, PA-C  DOB: 06/17/1976  PCP: System, Pcp Not In  DOS: 08/23/2017  Note by: Edward JollyBilal Laureano Hetzer, MD  Service setting: Ambulatory outpatient  Specialty: Interventional Pain Management  Patient type: Established  Location: [facility] (AMB) Pain Management Facility  Visit type: Interventional Procedure   Primary Reason for Visit: Interventional Pain Management Treatment. CC: Back Pain (lower)  Procedure #1:  Anesthesia, Analgesia, Anxiolysis:  Type: Diagnostic Inter-Laminar Epidural Steroid Injection #1  Region: Lumbar Level: L4-5 Level. Laterality: Midline         Type: Local Anesthesia with Moderate (Conscious) Sedation Local Anesthetic: Lidocaine 1% Route: Intravenous (IV) IV Access: Secured Sedation: Meaningful verbal contact was maintained at all times during the procedure  Indication(s): Analgesia and Anxiety   Indications: 1. Lumbar radiculopathy   2. Lumbar degenerative disc disease   3. Lumbar disc herniation with radiculopathy   4. Chronic pain syndrome    Pain Score: Pre-procedure: 5 /10 Post-procedure: 0-No pain/10  Pre-op Assessment:  Nathaniel Chen is a 42 y.o. (year old), male patient, seen today for interventional treatment. He  has a past surgical history that includes Finger surgery. Nathaniel Chen has a current medication list which includes the following prescription(s): hydrocodone-acetaminophen, ibuprofen, and orphenadrine, and the following Facility-Administered Medications: lactated ringers and midazolam. His primarily concern today is the Back Pain (lower)  Initial Vital Signs:  Pulse Rate: (!) 58 Temp: 98.2 F (36.8 C) Resp: 16 BP: (!) 108/39 SpO2: 100 %  BMI: Estimated body mass index is 22.05 kg/m as calculated from the following:   Height as of this encounter: 5\' 8"  (1.727 m).   Weight as of this encounter: 145 lb (65.8 kg).  Risk Assessment: Allergies: Reviewed. He has No Known  Allergies.  Allergy Precautions: None required Coagulopathies: Reviewed. None identified.  Blood-thinner therapy: None at this time Active Infection(s): Reviewed. None identified. Nathaniel Chen is afebrile  Site Confirmation: Nathaniel Chen was asked to confirm the procedure and laterality before marking the site Procedure checklist: Completed Consent: Before the procedure and under the influence of no sedative(s), amnesic(s), or anxiolytics, the patient was informed of the treatment options, risks and possible complications. To fulfill our ethical and legal obligations, as recommended by the American Medical Association's Code of Ethics, I have informed the patient of my clinical impression; the nature and purpose of the treatment or procedure; the risks, benefits, and possible complications of the intervention; the alternatives, including doing nothing; the risk(s) and benefit(s) of the alternative treatment(s) or procedure(s); and the risk(s) and benefit(s) of doing nothing. The patient was provided information about the general risks and possible complications associated with the procedure. These may include, but are not limited to: failure to achieve desired goals, infection, bleeding, organ or nerve damage, allergic reactions, paralysis, and death. In addition, the patient was informed of those risks and complications associated to Spine-related procedures, such as failure to decrease pain; infection (i.e.: Meningitis, epidural or intraspinal abscess); bleeding (i.e.: epidural hematoma, subarachnoid hemorrhage, or any other type of intraspinal or peri-dural bleeding); organ or nerve damage (i.e.: Any type of peripheral nerve, nerve root, or spinal cord injury) with subsequent damage to sensory, motor, and/or autonomic systems, resulting in permanent pain, numbness, and/or weakness of one or several areas of the body; allergic reactions; (i.e.: anaphylactic reaction); and/or death. Furthermore, the  patient was informed of those risks and complications associated with the medications. These include, but are not limited to: allergic reactions (i.e.:  anaphylactic or anaphylactoid reaction(s)); adrenal axis suppression; blood sugar elevation that in diabetics may result in ketoacidosis or comma; water retention that in patients with history of congestive heart failure may result in shortness of breath, pulmonary edema, and decompensation with resultant heart failure; weight gain; swelling or edema; medication-induced neural toxicity; particulate matter embolism and blood vessel occlusion with resultant organ, and/or nervous system infarction; and/or aseptic necrosis of one or more joints. Finally, the patient was informed that Medicine is not an exact science; therefore, there is also the possibility of unforeseen or unpredictable risks and/or possible complications that may result in a catastrophic outcome. The patient indicated having understood very clearly. We have given the patient no guarantees and we have made no promises. Enough time was given to the patient to ask questions, all of which were answered to the patient's satisfaction. Nathaniel Chen has indicated that he wanted to continue with the procedure. Attestation: I, the ordering provider, attest that I have discussed with the patient the benefits, risks, side-effects, alternatives, likelihood of achieving goals, and potential problems during recovery for the procedure that I have provided informed consent. Date: 08/23/2017  Time: N/A  Pre-Procedure Preparation:  Monitoring: As per clinic protocol. Respiration, ETCO2, SpO2, BP, heart rate and rhythm monitor placed and checked for adequate function Safety Precautions: Patient was assessed for positional comfort and pressure points before starting the procedure. Time-out: I initiated and conducted the "Time-out" before starting the procedure, as per protocol. The patient was asked to participate  by confirming the accuracy of the "Time Out" information. Verification of the correct person, site, and procedure were performed and confirmed by me, the nursing staff, and the patient. "Time-out" conducted as per Joint Commission's Universal Protocol (UP.01.01.01). "Time-out" Date & Time: 08/23/2017; 0956 hrs.  Description of Procedure:       Position: Prone with head of the table was raised to facilitate breathing. Target Area: The interlaminar space, initially targeting the lower laminar border of the superior vertebral body. Approach: Paramedial approach. Area Prepped: Entire Posterior Lumbar Region Prepping solution: ChloraPrep (2% chlorhexidine gluconate and 70% isopropyl alcohol) Safety Precautions: Aspiration looking for blood return was conducted prior to all injections. At no point did we inject any substances, as a needle was being advanced. No attempts were made at seeking any paresthesias. Safe injection practices and needle disposal techniques used. Medications properly checked for expiration dates. SDV (single dose vial) medications used. Description of the Procedure: Protocol guidelines were followed. The procedure needle was introduced through the skin, ipsilateral to the reported pain, and advanced to the target area. Bone was contacted and the needle walked caudad, until the lamina was cleared. The epidural space was identified using "loss-of-resistance technique" with 2-3 ml of PF-NaCl (0.9% NSS), in a 5cc LOR glass syringe. Vitals:   08/23/17 1005 08/23/17 1010 08/23/17 1014 08/23/17 1024  BP: 110/62 113/64 107/65 114/70  Pulse: 69 68    Resp: 18 16  16   Temp:      TempSrc:      SpO2: 100% 100% 98% 99%  Weight:      Height:        Start Time: 0956 hrs. End Time: 1006 hrs. Materials:  Needle(s) Type: Epidural needle Gauge: 17G Length: 3.5-in Medication(s): Please see orders for medications and dosing details. 8 CC solution made of 5 cc of preservative-free normal  saline, 2 cc of 0.2% ropivacaine, 1 cc of Decadron 10 mg/cc. Imaging Guidance (Spinal):  Type of Imaging Technique: Fluoroscopy Guidance (Spinal)  Indication(s): Assistance in needle guidance and placement for procedures requiring needle placement in or near specific anatomical locations not easily accessible without such assistance. Exposure Time: Please see nurses notes. Contrast: Before injecting any contrast, we confirmed that the patient did not have an allergy to iodine, shellfish, or radiological contrast. Once satisfactory needle placement was completed at the desired level, radiological contrast was injected. Contrast injected under live fluoroscopy. No contrast complications. See chart for type and volume of contrast used. Fluoroscopic Guidance: I was personally present during the use of fluoroscopy. "Tunnel Vision Technique" used to obtain the best possible view of the target area. Parallax error corrected before commencing the procedure. "Direction-depth-direction" technique used to introduce the needle under continuous pulsed fluoroscopy. Once target was reached, antero-posterior, oblique, and lateral fluoroscopic projection used confirm needle placement in all planes. Images permanently stored in EMR. Interpretation: I personally interpreted the imaging intraoperatively. Adequate needle placement confirmed in multiple planes. Appropriate spread of contrast into desired area was observed. No evidence of afferent or efferent intravascular uptake. No intrathecal or subarachnoid spread observed. Permanent images saved into the patient's record.  Antibiotic Prophylaxis:   Anti-infectives (From admission, onward)   None     Indication(s): None identified  Post-operative Assessment:  Post-procedure Vital Signs:  Pulse Rate: 68 Temp: 98.2 F (36.8 C) Resp: 16 BP: 114/70 SpO2: 99 %  EBL: None  Complications: No immediate post-treatment complications observed by team, or reported by  patient.  Note: The patient tolerated the entire procedure well. A repeat set of vitals were taken after the procedure and the patient was kept under observation following institutional policy, for this type of procedure. Post-procedural neurological assessment was performed, showing return to baseline, prior to discharge. The patient was provided with post-procedure discharge instructions, including a section on how to identify potential problems. Should any problems arise concerning this procedure, the patient was given instructions to immediately contact us, at any time, without hesitation. In any case, we plan to contact the patient by telephone for a follow-up status report regarding this interventional procedure.  Comments:  No additional relevant information. 5 out of 5 strength bilateral lower extremity: Plantar flexion, dorsiflexion, knee flexion, knee extension.  Plan of Care   Imaging Orders     DG C-Arm 1-60 Min-No Report Procedure Orders    No procedure(s) ordered today    Medications ordered for procedure: Meds ordered this encounter  Medications  . lactated ringers infusion 1,000 mL  . midazolam (VERSED) 5 MG/5ML injection 1-2 mg    Make sure Flumazenil is available in the pyxis when using this medication. If oversedation occurs, administer 0.2 mg IV over 15 sec. If after 45 sec no response, administer 0.2 mg again over 1 min; may repeat at 1 min intervals; not to exceed 4 doses (1 mg)  . iopamidol (ISOVUE-M) 41 % intrathecal injection 10 mL  . ropivacaine (PF) 2 mg/mL (0.2%) (NAROPIN) injection 2 mL  . sodium chloride flush (NS) 0.9 % injection 2 mL  . dexamethasone (DECADRON) injection 10 mg  . lidocaine (PF) (XYLOCAINE) 1 % injection 4.5 mL   Medications administered: We administered lactated ringers, midazolam, iopamidol, ropivacaine (PF) 2 mg/mL (0.2%), sodium chloride flush, dexamethasone, and lidocaine (PF).  See the medical record for exact dosing, route, and time  of administration.  New Prescriptions   No medications on file   Disposition: Discharge home  Discharge Date & Time: 08/23/2017;   hrs.   Physician-requested Follow-up: Return in about 3 weeks (around  09/13/2017) for Post Procedure Evaluation.  Future Appointments  Date Time Provider Department Center  09/13/2017 12:00 PM Edward Jolly, MD [facility] None   Primary Care Physician: System, Pcp Not In Location: [facility] Note by: Edward Jolly, MD Date: 08/23/2017; Time: 10:31 AM  Disclaimer:  Medicine is not an exact science. The only guarantee in medicine is that nothing is guaranteed. It is important to note that the decision to proceed with this intervention was based on the information collected from the patient. The Data and conclusions were drawn from the patient's questionnaire, the interview, and the physical examination. Because the information was provided in large part by the patient, it cannot be guaranteed that it has not been purposely or unconsciously manipulated. Every effort has been made to obtain as much relevant data as possible for this evaluation. It is important to note that the conclusions that lead to this procedure are derived in large part from the available data. Always take into account that the treatment will also be dependent on availability of resources and existing treatment guidelines, considered by other Pain Management Practitioners as being common knowledge and practice, at the time of the intervention. For Medico-Legal purposes, it is also important to point out that variation in procedural techniques and pharmacological choices are the acceptable norm. The indications, contraindications, technique, and results of the above procedure should only be interpreted and judged by a Board-Certified Interventional Pain Specialist with extensive familiarity and expertise in the same exact procedure and technique.

## 2017-08-23 NOTE — Progress Notes (Signed)
Patient's Name: Nathaniel Chen  MRN: [id number]  Referring Provider: Marin Olp, PA-C  DOB: 1976-03-14  PCP: System, Pcp Not In  DOS: 08/23/2017  Note by: Gillis Santa, MD  Service setting: Ambulatory outpatient  Specialty: Interventional Pain Management  Location: [facility] (AMB) Pain Management Facility    Patient type: New patient ("FAST-TRACK" Evaluation)   Warning: This referral option does not include the extensive pharmacological evaluation required for [place] to take over the patient's medication management. The "Fast-Track" system is designed to bypass the new patient referral waiting list, as well as the normal patient evaluation process, in order to provide a patient in distress with a timely pain management intervention. Because the system was not designed to unfairly get a patient into our pain practice ahead of those already waiting, certain restrictions apply. By requesting a "Fast-Track" consult, the referring physician has opted to continue managing the patient's medications in order to get interventional urgent care.  Primary Reason for Visit: Interventional Pain Management Treatment. CC: Back Pain (lower)   Procedure  HPI  [name] is a 42 y.o. year old, male patient, who comes today for a  "Fast-Track" new patient evaluation, as requested by Marin Olp, PA-C. The patient has been made aware that this type of referral option is reserved for the Interventional Pain Management portion of our practice and completely excludes the option of medication management. His primarily concern today is the Back Pain (lower)  Pain Assessment: Location: Lower Back Radiating: denies Onset: More than a month ago Duration: Chronic pain Quality: Dull, Aching, Burning, Shooting, Sharp Severity: 5 /10 (self-reported pain score)  Note: Reported level is inconsistent with clinical observations. Clinically the patient looks like a 3/10             When using our objective Pain Scale, levels between 6  and 10/10 are said to belong in an emergency room, as it progressively worsens from a 6/10, described as severely limiting, requiring emergency care not usually available at an outpatient pain management facility. At a 6/10 level, communication becomes difficult and requires great effort. Assistance to reach the emergency department may be required. Facial flushing and profuse sweating along with potentially dangerous increases in heart rate and blood pressure will be evident. Effect on ADL:   Timing: Constant Modifying factors: lying on back with knees bent  Onset and Duration: Date of injury: 2007 and Present longer than 3 months Cause of pain: picking up something Severity: Getting better, NAS-11 at its worse: 103/10, NAS-11 at its best: 3/10, NAS-11 now: 4/10 and NAS-11 on the average: 4/10 Timing: Morning, During activity or exercise and After activity or exercise Aggravating Factors: Bending and Twisting Alleviating Factors: Lying down Associated Problems: Pain that wakes patient up Quality of Pain: Aching, Sharp and Shooting Previous Examinations or Tests: MRI scan and X-rays Previous Treatments: Epidural steroid injections and Narcotic medications  The patient comes into the clinics today, referred to [place] for a lumbar epidural steroid injection.  Briefly patient is a 42 year old male who has a history of chronic axial low back pain with intermittent pain flares.  He states that he has had a pain flare since September which is been very bothersome.  Patient has difficulty lifting and twisting.  He states that the pain initially started many years ago secondary to a lifting accident however got worse this past September.  Patient has had an epidural steroid injection many years ago.  He is unable to participate in physical therapy given financial restrictions and  time limitations.  He is referred from [facility] neurosurgery for lumbar epidural steroid injection.  Meds   Current Outpatient  Medications:  .  HYDROcodone-acetaminophen (NORCO) 7.5-325 MG tablet, Take 1 tablet by mouth 2 (two) times daily as needed for moderate pain., Disp: , Rfl:  .  ibuprofen (ADVIL,MOTRIN) 800 MG tablet, Take 800 mg by mouth every 8 (eight) hours as needed., Disp: , Rfl:  .  orphenadrine (NORFLEX) 100 MG tablet, Take 1 tablet (100 mg total) 2 (two) times daily as needed by mouth for muscle spasms., Disp: 60 tablet, Rfl: 0  Current Facility-Administered Medications:  .  lactated ringers infusion 1,000 mL, 1,000 mL, Intravenous, Once, Fernande Treiber, MD, Last Rate: 125 mL/hr at 08/23/17 1002, 1,000 mL at 08/23/17 1002 .  midazolam (VERSED) 5 MG/5ML injection 1-2 mg, 1-2 mg, Intravenous, Q5 min PRN, Holley Raring, Minnah Llamas, MD, 1 mg at 08/23/17 3428  Imaging Review   Lumbosacral Imaging: Lumbar MR wo contrast:  Results for orders placed during the hospital encounter of 05/26/17  MR LUMBAR SPINE WO CONTRAST   Narrative CLINICAL DATA:  Low back and bilateral leg pain 1 week after lifting injury.  EXAM: MRI LUMBAR SPINE WITHOUT CONTRAST  TECHNIQUE: Multiplanar, multisequence MR imaging of the lumbar spine was performed. No intravenous contrast was administered.  COMPARISON:  Lumbar spine radiographs 05/19/2017  FINDINGS: Segmentation: Conventional anatomy assumed, with the last open disc space designated L5-S1.  Alignment:  Normal.  Vertebrae: No worrisome osseous lesion, acute fracture or pars defect. The visualized sacroiliac joints appear unremarkable.  Conus medullaris: Extends to the T12 level and is not well visualized.  Paraspinal and other soft tissues: No significant paraspinal findings.  Disc levels:  No significant disc space findings from T12-L1 through L2-3.  L3-4: Mild disc desiccation and bulging asymmetric laterally to the left. Mild bilateral facet hypertrophy. There is minimal narrowing of the left lateral recess. The foramina are patent.  L4-5: Mild loss of disc height  with annular disc bulging. Mild facet and ligamentous hypertrophy. There is mild narrowing of the lateral recesses. The foramina are patent.  L5-S1: Disc height and hydration are maintained. There is a shallow left paracentral disc protrusion without resulting nerve root encroachment. The foramina are patent.  IMPRESSION: 1. No high-grade spinal stenosis or definite nerve root encroachment. 2. Mild disc bulging eccentric to the left at L3-4 with resulting mild narrowing of the left lateral recess. 3. Mild narrowing of both lateral recesses at L4-5 due to disc degeneration and annular bulging. 4. Shallow left paracentral disc protrusion at L5-S1 without nerve root encroachment.   Electronically Signed   By: Richardean Sale M.D.   On: 05/26/2017 19:09     Lumbar DG (Complete) 4+V:  Results for orders placed during the hospital encounter of 05/19/17  DG Lumbar Spine Complete   Narrative CLINICAL DATA:  Low back pain. Right leg radiculopathy. Lifting heavy furniture today.  EXAM: LUMBAR SPINE - COMPLETE 4+ VIEW  COMPARISON:  None.  FINDINGS: Right greater than left degenerative facet arthropathy at L5-S1. 3 mm degenerative retrolisthesis at L5-S1. No fracture or subluxation identified.  IMPRESSION: 1. 3 mm degenerative retrolisthesis at L5-S1 associated with mild degenerative facet arthropathy.   Electronically Signed   By: Van Clines M.D.   On: 05/19/2017 17:57                            Complexity Note: Imaging results reviewed. Results shared with Mr. Levingston,  using Layman's terms.                         ROS  Cardiovascular History: No reported cardiovascular signs or symptoms such as High blood pressure, coronary artery disease, abnormal heart rate or rhythm, heart attack, blood thinner therapy or heart weakness and/or failure Pulmonary or Respiratory History: No reported pulmonary signs or symptoms such as wheezing and difficulty taking a deep full  breath (Asthma), difficulty blowing air out (Emphysema), coughing up mucus (Bronchitis), persistent dry cough, or temporary stoppage of breathing during sleep Neurological History: No reported neurological signs or symptoms such as seizures, abnormal skin sensations, urinary and/or fecal incontinence, being born with an abnormal open spine and/or a tethered spinal cord Review of Past Neurological Studies: No results found for this or any previous visit. Psychological-Psychiatric History: No reported psychological or psychiatric signs or symptoms such as difficulty sleeping, anxiety, depression, delusions or hallucinations (schizophrenial), mood swings (bipolar disorders) or suicidal ideations or attempts Gastrointestinal History: No reported gastrointestinal signs or symptoms such as vomiting or evacuating blood, reflux, heartburn, alternating episodes of diarrhea and constipation, inflamed or scarred liver, or pancreas or irrregular and/or infrequent bowel movements Genitourinary History: No reported renal or genitourinary signs or symptoms such as difficulty voiding or producing urine, peeing blood, non-functioning kidney, kidney stones, difficulty emptying the bladder, difficulty controlling the flow of urine, or chronic kidney disease Hematological History: No reported hematological signs or symptoms such as prolonged bleeding, low or poor functioning platelets, bruising or bleeding easily, hereditary bleeding problems, low energy levels due to low hemoglobin or being anemic Endocrine History: No reported endocrine signs or symptoms such as high or low blood sugar, rapid heart rate due to high thyroid levels, obesity or weight gain due to slow thyroid or thyroid disease Rheumatologic History: No reported rheumatological signs and symptoms such as fatigue, joint pain, tenderness, swelling, redness, heat, stiffness, decreased range of motion, with or without associated rash Musculoskeletal History:  Negative for myasthenia gravis, muscular dystrophy, multiple sclerosis or malignant hyperthermia Work History: Working full time  Allergies  Mr. Bauer has No Known Allergies.  Laboratory Chemistry  Inflammation Markers (CRP: Acute Phase) (ESR: Chronic Phase) No results found for: CRP, ESRSEDRATE, LATICACIDVEN               Rheumatology Markers No results found for: RF, ANA, LABURIC, URICUR, LYMEIGGIGMAB, LYMEABIGMQN              Renal Function Markers No results found for: BUN, CREATININE, GFRAA, GFRNONAA               Hepatic Function Markers No results found for: AST, ALT, ALBUMIN, ALKPHOS, HCVAB, AMYLASE, LIPASE, AMMONIA               Electrolytes No results found for: NA, K, CL, CALCIUM, MG, PHOS               Neuropathy Markers No results found for: VITAMINB12, FOLATE, HGBA1C, HIV               Bone Pathology Markers No results found for: VD25OH, FY101BP1WCH, EN2778EU2, PN3614ER1, 25OHVITD1, 25OHVITD2, 25OHVITD3, TESTOFREE, TESTOSTERONE               Coagulation Parameters No results found for: INR, LABPROT, APTT, PLT, DDIMER               Cardiovascular Markers No results found for: BNP, CKTOTAL, CKMB, TROPONINI, HGB, HCT  CA Markers No results found for: CEA, CA125, LABCA2               Note: Lab results reviewed.  [place]  Drug: Mr. Brander  reports that he does not use drugs. Alcohol:  reports that he does not drink alcohol. Tobacco:  reports that he has been smoking cigarettes.  He has been smoking about 1.00 pack per day. he has never used smokeless tobacco. Medical:  has a past medical history of Back pain and Ruptured disk. Family: family history is not on file.  Past Surgical History:  Procedure Laterality Date  . FINGER SURGERY     Active Ambulatory Problems    Diagnosis Date Noted  . Chronic low back pain 07/02/2017   Resolved Ambulatory Problems    Diagnosis Date Noted  . No Resolved Ambulatory Problems   Past Medical History:   Diagnosis Date  . Back pain   . Ruptured disk    Constitutional Exam  General appearance: Well nourished, well developed, and well hydrated. In no apparent acute distress Vitals:   08/23/17 0919 08/23/17 0955 08/23/17 1000 08/23/17 1005  BP: (!) 108/39 125/64 110/69   Pulse: (!) 58 69 73 69  Resp: _0 Temp: 98.2 F (36.8 C)     TempSrc: Oral     SpO2: 100% 100% 100% 100%  Weight: 145 lb (65.8 kg)     Height: _1  (1.727 m)      BMI Assessment: Estimated body mass index is 22.05 kg/m as calculated from the following:   Height as of this encounter: _2  (1.727 m).   Weight as of this encounter: 145 lb (65.8 kg).  BMI interpretation table: BMI level Category Range association with higher incidence of chronic pain  <18 kg/m2 Underweight   18.5-24.9 kg/m2 Ideal body weight   25-29.9 kg/m2 Overweight Increased incidence by 20%  30-34.9 kg/m2 Obese (Class I) Increased incidence by 68%  35-39.9 kg/m2 Severe obesity (Class II) Increased incidence by 136%  >40 kg/m2 Extreme obesity (Class III) Increased incidence by 254%   BMI Readings from Last 4 Encounters:  08/23/17 22.05 kg/m  07/02/17 21.68 kg/m  06/05/17 22.05 kg/m  05/26/17 22.05 kg/m   Wt Readings from Last 4 Encounters:  08/23/17 145 lb (65.8 kg)  07/02/17 142 lb 9.6 oz (64.7 kg)  06/05/17 145 lb (65.8 kg)  05/26/17 145 lb (65.8 kg)  Psych/Mental status: Alert, oriented x 3 (person, place, & time)       Eyes: PERLA Respiratory: No evidence of acute respiratory distress  Lumbar Spine Area Exam  Skin & Axial Inspection: No masses, redness, or swelling Alignment: Symmetrical Functional ROM: Unrestricted ROM      Stability: No instability detected Muscle Tone/Strength: Functionally intact. No obvious neuro-muscular anomalies detected. Sensory (Neurological): Unimpaired Palpation: Complains of area being tender to palpation Bilateral Fist Percussion Test Provocative Tests: Lumbar Hyperextension and  rotation test: Equivocal bilaterally for facet joint pain. Lumbar Lateral bending test: Positive ipsilateral radicular pain, bilaterally. Positive for bilateral foraminal stenosis. Patrick's Maneuver: evaluation deferred today                    Gait & Posture Assessment  Ambulation: Unassisted Gait: Relatively normal for age and body habitus Posture: WNL   Lower Extremity Exam    Side: Right lower extremity  Side: Left lower extremity  Skin & Extremity Inspection: Skin color, temperature, and hair growth are WNL. No peripheral edema or cyanosis. No  masses, redness, swelling, asymmetry, or associated skin lesions. No contractures.  Skin & Extremity Inspection: Skin color, temperature, and hair growth are WNL. No peripheral edema or cyanosis. No masses, redness, swelling, asymmetry, or associated skin lesions. No contractures.  Functional ROM: Unrestricted ROM          Functional ROM: Unrestricted ROM          Muscle Tone/Strength: Functionally intact. No obvious neuro-muscular anomalies detected.  Muscle Tone/Strength: Functionally intact. No obvious neuro-muscular anomalies detected.  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  Patient is referred to [place] as a fast track for lumbar epidural steroid injection.  Patient denies being on any blood thinners.  Risks and benefits of procedure were discussed.  Please see procedure note.

## 2017-08-23 NOTE — Patient Instructions (Signed)

## 2017-08-23 NOTE — Progress Notes (Signed)
Safety precautions to be maintained throughout the outpatient stay will include: orient to surroundings, keep bed in low position, maintain call bell within reach at all times, provide assistance with transfer out of bed and ambulation.  

## 2017-08-24 ENCOUNTER — Telehealth: Payer: Self-pay | Admitting: *Deleted

## 2017-08-24 NOTE — Telephone Encounter (Signed)
Patient called for follow up after procedure yesterday and has a voice mailbox that has not been set up yet.

## 2017-09-13 ENCOUNTER — Ambulatory Visit: Payer: Medicaid Other | Admitting: Student in an Organized Health Care Education/Training Program

## 2018-03-06 ENCOUNTER — Other Ambulatory Visit: Payer: Self-pay

## 2018-03-06 ENCOUNTER — Emergency Department
Admission: EM | Admit: 2018-03-06 | Discharge: 2018-03-06 | Disposition: A | Discharge status: Home / Self Care | Payer: Medicaid Other | Attending: Emergency Medicine | Admitting: Emergency Medicine

## 2018-03-06 ENCOUNTER — Encounter: Payer: Self-pay | Admitting: Emergency Medicine

## 2018-03-06 DIAGNOSIS — M545 Low back pain, unspecified: Secondary | ICD-10-CM

## 2018-03-06 DIAGNOSIS — F17210 Nicotine dependence, cigarettes, uncomplicated: Secondary | ICD-10-CM | POA: Insufficient documentation

## 2018-03-06 DIAGNOSIS — G8929 Other chronic pain: Secondary | ICD-10-CM | POA: Diagnosis not present

## 2018-03-06 MED ORDER — METHYLPREDNISOLONE SODIUM SUCC 125 MG IJ SOLR
80.0000 mg | Freq: Once | INTRAMUSCULAR | Status: AC
  Administered 2018-03-06: 80 mg via INTRAMUSCULAR
  Filled 2018-03-06: qty 2

## 2018-03-06 MED ORDER — PREDNISONE 10 MG PO TABS: ORAL_TABLET | ORAL | 0 refills | Status: DC

## 2018-03-06 MED ORDER — DICLOFENAC SODIUM 75 MG PO TBEC: 75.0000 mg | DELAYED_RELEASE_TABLET | Freq: Two times a day (BID) | ORAL | 0 refills | Status: DC

## 2018-03-06 NOTE — Discharge Instructions (Addendum)
You have been diagnosed with chronic back pain.  He received 80 mg of Solu-Medrol today.  I have sent a prescription for Prednisone over to your pharmacy, use as directed for the next 9 days.  Once you are done with Prednisone, please start Diclofenac twice daily.  Avoid other anti-inflammatories over-the-counter such as Advil, Ibuprofen, Motrin, Aleve or Naproxen.  Heat and massage may be helpful.  Continue your Hydrocodone and Norflex as prescribed by your primary care provider.

## 2018-03-06 NOTE — ED Triage Notes (Signed)
Pt presents to ED via POV with c/o chronic lower back pain. Pt states hx of having steroid shots for pain.

## 2018-03-06 NOTE — ED Provider Notes (Signed)
[facility] Emergency Department Provider Note ____________________________________________  Time seen: 1145  I have reviewed the triage vital signs and the nursing notes.  HISTORY  Chief Complaint  Back Pain   HPI Nathaniel Chen is a 42 y.o. male presents to the ER today with c/o chronic low back pain. This has been going of for 10 months. He denies any injury to the area. He describes the pain as sore and achy. The pain is worse with movement. The pain does not radiate. He denies issues with bowel or bladder. He had an xray11/2018 which showed:  IMPRESSION: 1. 3 mm degenerative retrolisthesis at L5-S1 associated with mild degenerative facet arthropathy.  He has had a MRI 05/2017 which showed:  IMPRESSION: 1. No high-grade spinal stenosis or definite nerve root encroachment. 2. Mild disc bulging eccentric to the left at L3-4 with resulting mild narrowing of the left lateral recess. 3. Mild narrowing of both lateral recesses at L4-5 due to disc degeneration and annular bulging. 4. Shallow left paracentral disc protrusion at L5-S1 without nerve root encroachment.  He reports he has received spinal injections by Dr. Cherylann Ratel with minimal relief. He is prescribed Norco 7.5-325 mg (#60 monthly) and Norflex by his PCP,  Dr. Clarene Duke.  Past Medical History:  Diagnosis Date  . Back pain   . Ruptured disk     Patient Active Problem List   Diagnosis Date Noted  . Chronic low back pain 07/02/2017    Past Surgical History:  Procedure Laterality Date  . FINGER SURGERY      Prior to Admission medications   Medication Sig Start Date End Date Taking? Authorizing Provider  diclofenac (VOLTAREN) 75 MG EC tablet Take 1 tablet (75 mg total) by mouth 2 (two) times daily. 03/06/18   Lorre Munroe, NP  HYDROcodone-acetaminophen (NORCO) 7.5-325 MG tablet Take 1 tablet by mouth 2 (two) times daily as needed for moderate pain.    [provider]  ibuprofen  (ADVIL,MOTRIN) 800 MG tablet Take 800 mg by mouth every 8 (eight) hours as needed.    [provider]  orphenadrine (NORFLEX) 100 MG tablet Take 1 tablet (100 mg total) 2 (two) times daily as needed by mouth for muscle spasms. 05/19/17 05/19/18  Little, Traci M, PA-C  predniSONE (DELTASONE) 10 MG tablet Take 3 tabs on days 1-3, take 2 tabs on days 4-6, take 1 tab on days 7-9 03/06/18   Lorre Munroe, NP    Allergies Patient has no known allergies.  History reviewed. No pertinent family history.  Social History Social History   Tobacco Use  . Smoking status: Current Every Day Smoker    Packs/day: 1.00    Types: Cigarettes  . Smokeless tobacco: Never Used  Substance Use Topics  . Alcohol use: No  . Drug use: No    Review of Systems  Constitutional: Negative for fever. Gastrointestinal: Negative for abdominal pain, vomiting and diarrhea. Genitourinary: Negative for dysuria. Musculoskeletal: Positive for back pain. Neurological: Negative for focal weakness or numbness. ____________________________________________  PHYSICAL EXAM:  VITAL SIGNS: ED Triage Vitals [03/06/18 1124]  Enc Vitals Group     BP 135/67     Pulse Rate 72     Resp 18     Temp 97.7 F (36.5 C)     Temp Source Oral     SpO2 100 %     Weight 140 lb (63.5 kg)     Height 5\' 8"  (1.727 m)     Head  Circumference      Peak Flow      Pain Score 10     Pain Loc      Pain Edu?      Excl. in GC?     Constitutional: Alert and oriented. Well appearing and in no distress. Cardiovascular: Normal rate, regular rhythm. Normal distal pulses. Respiratory: Normal respiratory effort. No wheezes/rales/rhonchi. Gastrointestinal: Soft and nontender.  No CVA tenderness. Musculoskeletal: Decreased flexion and extension of the spine.  Normal rotation and lateral bending.  Bony tenderness noted over the lumbar spine.  He is able to walk on tiptoes and heels.  Strength 5/5 bilateral lower extremities. Neurologic:  Normal gait without ataxia.  Negative SLR bilaterally. ____________________________________________  INITIAL IMPRESSION / ASSESSMENT AND PLAN / ED COURSE  Chronic Low Back Pain:  No indication for repeat imaging at this time 80 mg Solu Medrol IM today RX for Pred Taper x 9 days (avoid NSAID's OTC) RX for Diclofenac to start taking BID after you are finished with Prednisone (creatinine reviewed). Continue Hydrocodone and Norflex as prescribed by PCP   I reviewed the patient's prescription history over the last 12 months in the multi-state controlled substances database(s) that includes [place], [place], [place], [place], [place], [place], [place], [place], [place], [place], [place], [place], [place], and [place].  Results were notable for #60 of Hydrocodone-Acetaminophen 7.5-325 mg tabs by PCP monthly. ____________________________________________  FINAL CLINICAL IMPRESSION(S) / ED DIAGNOSES  Final diagnoses:  Chronic midline low back pain without sciatica         Lorre MunroeBaity, Regina W, NP 03/06/18 1207    Jeanmarie PlantMcShane, James A, MD 03/06/18 1511

## 2018-03-06 NOTE — ED Notes (Signed)

## 2018-03-17 ENCOUNTER — Encounter: Payer: Self-pay | Admitting: Emergency Medicine

## 2018-03-17 ENCOUNTER — Other Ambulatory Visit: Payer: Self-pay

## 2018-03-17 ENCOUNTER — Emergency Department
Admission: EM | Admit: 2018-03-17 | Discharge: 2018-03-17 | Disposition: A | Discharge status: Home / Self Care | Payer: Medicaid Other | Attending: Emergency Medicine | Admitting: Emergency Medicine

## 2018-03-17 DIAGNOSIS — F17210 Nicotine dependence, cigarettes, uncomplicated: Secondary | ICD-10-CM | POA: Diagnosis not present

## 2018-03-17 DIAGNOSIS — M5440 Lumbago with sciatica, unspecified side: Secondary | ICD-10-CM | POA: Insufficient documentation

## 2018-03-17 DIAGNOSIS — G8929 Other chronic pain: Secondary | ICD-10-CM | POA: Insufficient documentation

## 2018-03-17 DIAGNOSIS — Z79899 Other long term (current) drug therapy: Secondary | ICD-10-CM | POA: Diagnosis not present

## 2018-03-17 DIAGNOSIS — M545 Low back pain: Secondary | ICD-10-CM | POA: Diagnosis present

## 2018-03-17 MED ORDER — KETOROLAC TROMETHAMINE 30 MG/ML IJ SOLN
30.0000 mg | Freq: Once | INTRAMUSCULAR | Status: AC
  Administered 2018-03-17: 30 mg via INTRAMUSCULAR
  Filled 2018-03-17: qty 1

## 2018-03-17 MED ORDER — METHYLPREDNISOLONE SODIUM SUCC 125 MG IJ SOLR
125.0000 mg | Freq: Once | INTRAMUSCULAR | Status: AC
  Administered 2018-03-17: 125 mg via INTRAMUSCULAR
  Filled 2018-03-17: qty 2

## 2018-03-17 MED ORDER — KETOROLAC TROMETHAMINE 10 MG PO TABS: 10.0000 mg | ORAL_TABLET | Freq: Four times a day (QID) | ORAL | 0 refills | Status: AC | PRN

## 2018-03-17 NOTE — ED Notes (Signed)
Pt has chronic back pain here about 10 days ago and received a steroid shot, pt states his pain is worse and gets a shooting pain down his legs, denies any incontinent issues, states he feels a pressure in his anus. Pt is prescribed pain meds by PCP, states he has been taking more than usual but not more than prescribed. States he usually breaks them in half.  Pt last took a dose about 3 hours ago.   Pt given urine cup in case a sample is needed.

## 2018-03-17 NOTE — ED Triage Notes (Signed)
Pt reports that he is having his usual back pain with tingling going down his legs. Pt is able to ambulate without difficulty. Denies any new injury. Denies any GU symptoms. States that he usually gets steroid shots.

## 2018-03-17 NOTE — ED Provider Notes (Signed)
[facility] Emergency Department Provider Note  ____________________________________________  Time seen: Approximately 9:59 PM  I have reviewed the triage vital signs and the nursing notes.   HISTORY  Chief Complaint Back Pain    HPI Nathaniel Chen is a 42 y.o. male presents to the emergency department with 10 out of 10 aching low back pain with bilateral lower extremity radiculopathy.  No recent falls or traumas.  Patient has had issues chronically for approximately 1 year.  Patient had MRI of the lumbar spine which revealed mild degenerative changes but no cord impingement or significant herniation.  Patient has been taking Norco 7-1/2 mg for approximately 12 years prescribed by his primary care provider and that is what he has been taking for pain.  Patient recently sought care at the emergency department and was given Solu-Medrol and discharged with tapered steroid.  Patient denies bowel or bladder incontinence or saddle anesthesia. No fever or chills.  No alleviating measures of been attempted.   Past Medical History:  Diagnosis Date  . Back pain   . Ruptured disk     Patient Active Problem List   Diagnosis Date Noted  . Chronic low back pain 07/02/2017    Past Surgical History:  Procedure Laterality Date  . FINGER SURGERY      Prior to Admission medications   Medication Sig Start Date End Date Taking? Authorizing Provider  diclofenac (VOLTAREN) 75 MG EC tablet Take 1 tablet (75 mg total) by mouth 2 (two) times daily. 03/06/18   Lorre Munroe, NP  HYDROcodone-acetaminophen (NORCO) 7.5-325 MG tablet Take 1 tablet by mouth 2 (two) times daily as needed for moderate pain.    [provider]  ibuprofen (ADVIL,MOTRIN) 800 MG tablet Take 800 mg by mouth every 8 (eight) hours as needed.    [provider]  ketorolac (TORADOL) 10 MG tablet Take 1 tablet (10 mg total) by mouth every 6 (six) hours as needed for up to 5 days. 03/17/18 03/22/18   Orvil Feil, PA-C  orphenadrine (NORFLEX) 100 MG tablet Take 1 tablet (100 mg total) 2 (two) times daily as needed by mouth for muscle spasms. 05/19/17 05/19/18  Little, Traci M, PA-C  predniSONE (DELTASONE) 10 MG tablet Take 3 tabs on days 1-3, take 2 tabs on days 4-6, take 1 tab on days 7-9 03/06/18   Lorre Munroe, NP    Allergies Patient has no known allergies.  History reviewed. No pertinent family history.  Social History Social History   Tobacco Use  . Smoking status: Current Every Day Smoker    Packs/day: 1.00    Types: Cigarettes  . Smokeless tobacco: Never Used  Substance Use Topics  . Alcohol use: No  . Drug use: No     Review of Systems  Constitutional: No fever/chills Eyes: No visual changes. No discharge ENT: No upper respiratory complaints. Cardiovascular: no chest pain. Respiratory: no cough. No SOB. Gastrointestinal: No abdominal pain.  No nausea, no vomiting.  No diarrhea.  No constipation. Musculoskeletal: Patient has back pain.  Skin: Negative for rash, abrasions, lacerations, ecchymosis. Neurological: Negative for headaches, focal weakness or numbness.   ____________________________________________   PHYSICAL EXAM:  VITAL SIGNS: ED Triage Vitals  Enc Vitals Group     BP 03/17/18 1837 117/61     Pulse Rate 03/17/18 1837 67     Resp 03/17/18 1837 20     Temp 03/17/18 1837 98.5 F (36.9 C)     Temp Source 03/17/18 1837  Oral     SpO2 03/17/18 1837 99 %     Weight 03/17/18 1839 140 lb (63.5 kg)     Height 03/17/18 1839 5\' 8"  (1.727 m)     Head Circumference --      Peak Flow --      Pain Score 03/17/18 1837 10     Pain Loc --      Pain Edu? --      Excl. in GC? --      Constitutional: Alert and oriented. Well appearing and in no acute distress. Eyes: Conjunctivae are normal. PERRL. EOMI. Head: Atraumatic. Cardiovascular: Normal rate, regular rhythm. Normal S1 and S2.  Good peripheral circulation. Respiratory: Normal respiratory  effort without tachypnea or retractions. Lungs CTAB. Good air entry to the bases with no decreased or absent breath sounds. Musculoskeletal: Full range of motion to all extremities. No gross deformities appreciated.  No paraspinal muscle tenderness along the lumbar spine.  Negative straight leg raise bilaterally. Neurologic:  Normal speech and language. No gross focal neurologic deficits are appreciated.  Skin:  Skin is warm, dry and intact. No rash noted. Psychiatric: Mood and affect are normal. Speech and behavior are normal. Patient exhibits appropriate insight and judgement.   ____________________________________________   LABS (all labs ordered are listed, but only abnormal results are displayed)  Labs Reviewed - No data to display ____________________________________________  EKG   ____________________________________________  RADIOLOGY   No results found.  ____________________________________________    PROCEDURES  Procedure(s) performed:    Procedures    Medications  methylPREDNISolone sodium succinate (SOLU-MEDROL) 125 mg/2 mL injection 125 mg (125 mg Intramuscular Given 03/17/18 2046)  ketorolac (TORADOL) 30 MG/ML injection 30 mg (30 mg Intramuscular Given 03/17/18 2045)     ____________________________________________   INITIAL IMPRESSION / ASSESSMENT AND PLAN / ED COURSE  Pertinent labs & imaging results that were available during my care of the patient were reviewed by me and considered in my medical decision making (see chart for details).  Review of the  [place] was performed in accordance of the [place] prior to dispensing any controlled drugs.    Assessment and plan Low back pain Patient presents to the emergency department with chronic low back pain with bilateral lower extremity radiculopathy.  Patient brought me his empty Norco bottle to the emergency department tonight.  Patient reports that he is only able to refill his 15-day supply of Norco on  15 September.  I informed patient that I would not refill his Norco.  Patient was recently treated with a tapered steroid.  He requested an injection of the steroid in the emergency department tonight.  I agreed to give him an injection of Solu-Medrol but would not discharge him on tapered prednisone due to his recent steroid use.  Patient was also given an injection of Toradol and advised to continue taking Toradol at home.  Patient was also referred to pain management.  All patient questions were answered.    ____________________________________________  FINAL CLINICAL IMPRESSION(S) / ED DIAGNOSES  Final diagnoses:  Chronic bilateral low back pain with sciatica, sciatica laterality unspecified      NEW MEDICATIONS STARTED DURING THIS VISIT:  ED Discharge Orders         Ordered    ketorolac (TORADOL) 10 MG tablet  Every 6 hours PRN     03/17/18 2037              This chart was dictated using voice recognition software/Dragon. Despite best efforts to  proofread, errors can occur which can change the meaning. Any change was purely unintentional.    Gasper Lloyd 03/17/18 2208    Myrna Blazer, MD 03/17/18 [phone]

## 2018-03-31 ENCOUNTER — Other Ambulatory Visit: Payer: Self-pay | Admitting: Internal Medicine

## 2018-05-08 ENCOUNTER — Encounter: Payer: Self-pay | Admitting: Emergency Medicine

## 2018-05-08 ENCOUNTER — Other Ambulatory Visit: Payer: Self-pay

## 2018-05-08 ENCOUNTER — Emergency Department
Admission: EM | Admit: 2018-05-08 | Discharge: 2018-05-08 | Disposition: A | Discharge status: Home / Self Care | Payer: Self-pay | Attending: Emergency Medicine | Admitting: Emergency Medicine

## 2018-05-08 DIAGNOSIS — G8929 Other chronic pain: Secondary | ICD-10-CM | POA: Insufficient documentation

## 2018-05-08 DIAGNOSIS — M47817 Spondylosis without myelopathy or radiculopathy, lumbosacral region: Secondary | ICD-10-CM

## 2018-05-08 DIAGNOSIS — M545 Low back pain, unspecified: Secondary | ICD-10-CM

## 2018-05-08 DIAGNOSIS — M4697 Unspecified inflammatory spondylopathy, lumbosacral region: Secondary | ICD-10-CM | POA: Insufficient documentation

## 2018-05-08 DIAGNOSIS — F17210 Nicotine dependence, cigarettes, uncomplicated: Secondary | ICD-10-CM | POA: Insufficient documentation

## 2018-05-08 DIAGNOSIS — M5136 Other intervertebral disc degeneration, lumbar region: Secondary | ICD-10-CM | POA: Insufficient documentation

## 2018-05-08 MED ORDER — PREDNISONE 10 MG (21) PO TBPK: ORAL_TABLET | ORAL | 0 refills | Status: DC

## 2018-05-08 MED ORDER — KETOROLAC TROMETHAMINE 30 MG/ML IJ SOLN
30.0000 mg | Freq: Once | INTRAMUSCULAR | Status: AC
  Administered 2018-05-08: 30 mg via INTRAMUSCULAR
  Filled 2018-05-08: qty 1

## 2018-05-08 MED ORDER — ORPHENADRINE CITRATE 30 MG/ML IJ SOLN
60.0000 mg | INTRAMUSCULAR | Status: AC
  Administered 2018-05-08: 60 mg via INTRAMUSCULAR
  Filled 2018-05-08: qty 2

## 2018-05-08 MED ORDER — CELECOXIB 200 MG PO CAPS: 200.0000 mg | ORAL_CAPSULE | Freq: Every day | ORAL | 1 refills | Status: AC

## 2018-05-08 NOTE — ED Triage Notes (Signed)
Pt to ED via POV, pt states that he has chronic back pain. Pt states that the pain has been worse over the past 3 days. Pt states that he usually gets steroid shots to help with the pain. Pt is in NAD at this time.

## 2018-05-08 NOTE — ED Provider Notes (Signed)
[facility] Emergency Department Provider Note ____________________________________________  Time seen: 1418  I have reviewed the triage vital signs and the nursing notes.  HISTORY  Chief Complaint  chronic back pain  HPI Nathaniel Chen is a 42 y.o. male presents to the ED accompanied by his wife, for evaluation of chronic back pain.  Patient describes a history of ongoing chronic back pain secondary to underlying multilevel degenerative disc disease and facet arthritis.  He describes symptoms are usually well tolerated with daily ibuprofen as well as intermittent prednisone when he has an acute flare.  He denies any recent injury, accident, trauma, or fall.  He denies any distal paresthesias, footdrop, or incontinence.  He is in the process of applying for Medicare, and has no current primary provider.  Past Medical History:  Diagnosis Date  . Back pain   . Ruptured disk     Patient Active Problem List   Diagnosis Date Noted  . Chronic low back pain 07/02/2017    Past Surgical History:  Procedure Laterality Date  . FINGER SURGERY      Prior to Admission medications   Medication Sig Start Date End Date Taking? Authorizing Provider  celecoxib (CELEBREX) 200 MG capsule Take 1 capsule (200 mg total) by mouth daily. 05/08/18 07/07/18  Theo Reither, Charlesetta Ivory, PA-C  HYDROcodone-acetaminophen (NORCO) 7.5-325 MG tablet Take 1 tablet by mouth 2 (two) times daily as needed for moderate pain.    [provider]  ibuprofen (ADVIL,MOTRIN) 800 MG tablet Take 800 mg by mouth every 8 (eight) hours as needed.    [provider]  predniSONE (STERAPRED UNI-PAK 21 TAB) 10 MG (21) TBPK tablet 6-day taper as directed. 05/08/18   Tremayne Sheldon, Charlesetta Ivory, PA-C    Allergies Patient has no known allergies.  No family history on file.  Social History Social History   Tobacco Use  . Smoking status: Current Every Day Smoker    Packs/day: 1.00    Types:  Cigarettes  . Smokeless tobacco: Never Used  Substance Use Topics  . Alcohol use: No  . Drug use: No    Review of Systems  Constitutional: Negative for fever. Cardiovascular: Negative for chest pain. Respiratory: Negative for shortness of breath. Gastrointestinal: Negative for abdominal pain, vomiting and diarrhea. Genitourinary: Negative for dysuria. Musculoskeletal: Positive for back pain. Skin: Negative for rash. Neurological: Negative for headaches, focal weakness or numbness. ____________________________________________  PHYSICAL EXAM:  VITAL SIGNS: ED Triage Vitals  Enc Vitals Group     BP 05/08/18 1242 125/61     Pulse Rate 05/08/18 1242 67     Resp 05/08/18 1242 16     Temp 05/08/18 1242 98.3 F (36.8 C)     Temp Source 05/08/18 1242 Oral     SpO2 05/08/18 1242 100 %     Weight 05/08/18 1241 140 lb (63.5 kg)     Height 05/08/18 1241 5\' 8"  (1.727 m)     Head Circumference --      Peak Flow --      Pain Score 05/08/18 1241 10     Pain Loc --      Pain Edu? --      Excl. in GC? --     Constitutional: Alert and oriented. Well appearing and in no distress. Head: Normocephalic and atraumatic. Eyes: Conjunctivae are normal. Normal extraocular movements Cardiovascular: Normal rate, regular rhythm. Normal distal pulses. Respiratory: Normal respiratory effort. No wheezes/rales/rhonchi. Gastrointestinal: Soft and nontender. No distention. Musculoskeletal: Normal spinal  alignment without midline tenderness, spasm, deformity, or step-off.  Patient with normal lumbar flexion and extension range on exam.  Nontender with normal range of motion in all extremities.  Neurologic: Cranial nerves II through XII grossly intact.  Normal LE DTRs bilaterally.  Normal toe dorsiflexion foot eversion on exam.  Normal gait without ataxia. Normal speech and language. No gross focal neurologic deficits are appreciated. Skin:  Skin is warm, dry and intact. No rash noted. Psychiatric: Mood  and affect are normal. Patient exhibits appropriate insight and judgment. ____________________________________________   RADIOLOGY  MRI Lumbar Spine 05/2017  IMPRESSION: 1. No high-grade spinal stenosis or definite nerve root encroachment. 2. Mild disc bulging eccentric to the left at L3-4 with resulting mild narrowing of the left lateral recess. 3. Mild narrowing of both lateral recesses at L4-5 due to disc degeneration and annular bulging. 4. Shallow left paracentral disc protrusion at L5-S1 without nerve root encroachment. ____________________________________________  PROCEDURES  Procedures  Toradol 30 mg IM Norflex 60 mg IM ____________________________________________  INITIAL IMPRESSION / ASSESSMENT AND PLAN / ED COURSE  Patient with ED evaluation of acute on chronic low back pain.  Patient's exam is overall benign at this time.  Symptoms may be related to his facet arthropathy at L5-S1.  Patient will be discharged with a prescription for Celebrex to take after his 6-day steroid taper.  He will continue with his medications at home which include Flexeril.  He is referred to medication management program for option refills.  He will follow-up with the local community clinics for ongoing symptom management. ____________________________________________  FINAL CLINICAL IMPRESSION(S) / ED DIAGNOSES  Final diagnoses:  Chronic midline low back pain without sciatica  Facet arthritis of lumbosacral region  DDD (degenerative disc disease), lumbar      Karmen Stabs, Charlesetta Ivory, PA-C 05/08/18 1938    Jeanmarie Plant, MD 05/19/18 [phone]

## 2018-05-08 NOTE — ED Notes (Signed)
Pt here for chronic back pain - last seen was given rx for toradol and referral to [facility]. Pt states no insurance - has applied for medicaid not accepted yet. States here for steroid shot which he states helps for about 3 weeks. No incontinence. Pain with bending or sitting without a pillow behind his lumbar area. States weakness in both legs with the pain.

## 2018-05-08 NOTE — Discharge Instructions (Signed)
Your exam and previous MRI are consistent with multiple level degenerative disc disease. You also have some facet arthritis, which

## 2019-03-14 IMAGING — MR MR LUMBAR SPINE W/O CM
5 series · 35 of 48 positions shown · non-contrast
Comparison: Lumbar spine radiographs 05/19/2017

CLINICAL DATA: Low back and bilateral leg pain 1 week after lifting
injury.

EXAM:
MRI LUMBAR SPINE WITHOUT CONTRAST
TECHNIQUE: Multiplanar, multisequence MR imaging of the lumbar spine was
performed. No intravenous contrast was administered.

[Series 2: T2 · sagittal · 4.0mm · 0.81mm/px · 6 of 15 slices shown (1 of 2)]
[im 1/15]
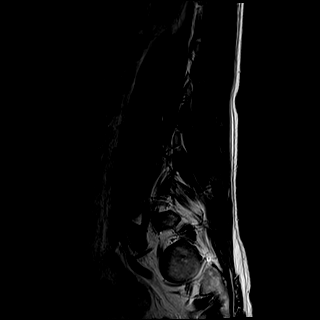
[im 3/15]
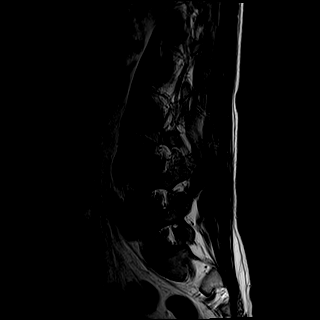
[im 6/15]
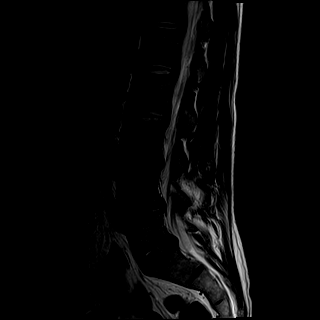
[im 9/15]
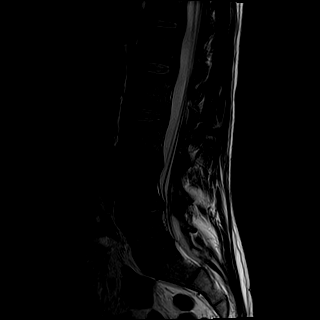
[im 12/15]
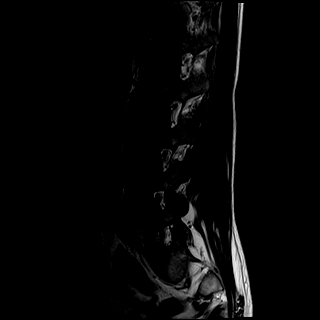
[im 15/15]
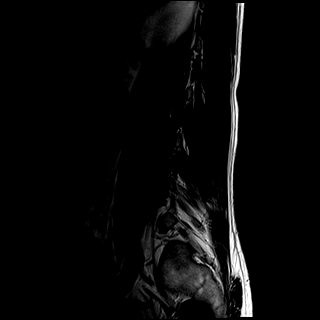

[Series 3: T1 · sagittal · 4.0mm · 0.81mm/px · 5 of 15 slices shown (1 of 2)]
[im 1/15]
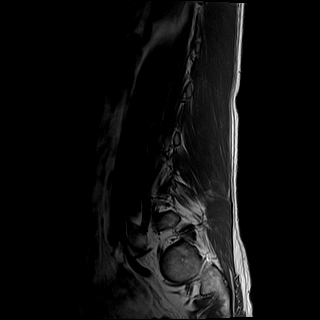
[im 4/15]
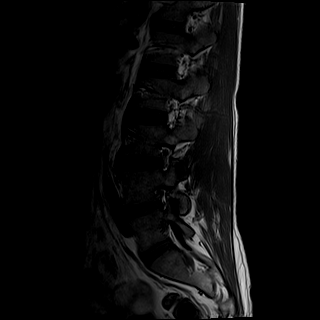
[im 8/15]
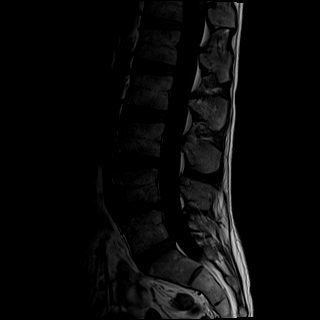
[im 11/15]
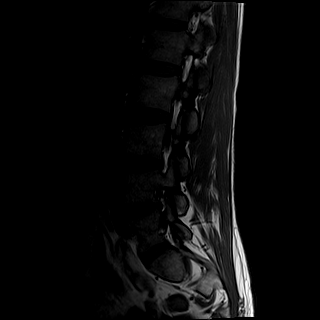
[im 15/15]
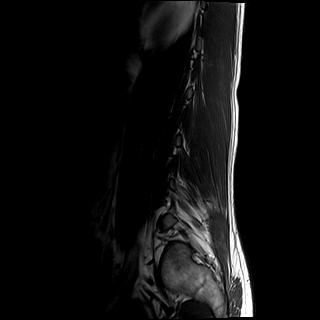

[Series 4: STIR · sagittal · 4.0mm · 1.02mm/px · 4 of 15 slices shown]
[im 1/15]
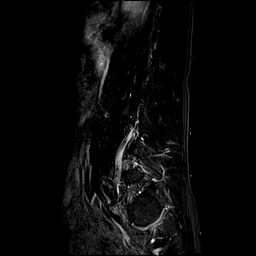
[im 4/15]
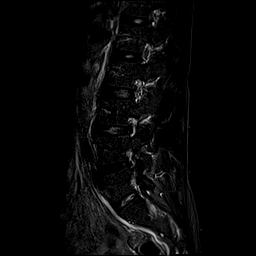
[im 8/15]
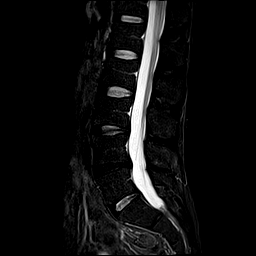
[im 11/15]
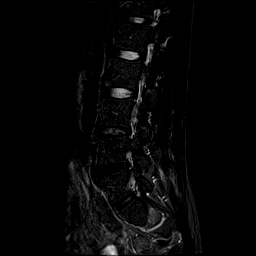

[Series 5: T2 · axial · 4.0mm · 0.78mm/px · z∈[-151,+77]mm · 10 of 43 slices shown (2 of 2)]
[im 3/43]
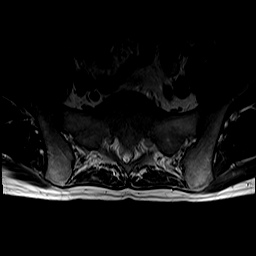
[im 6/43]
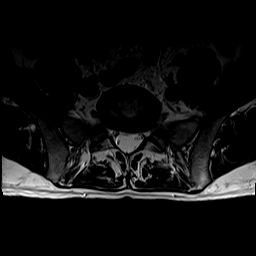
[im 9/43]
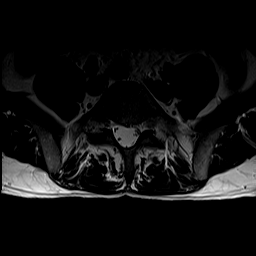
[im 15/43]
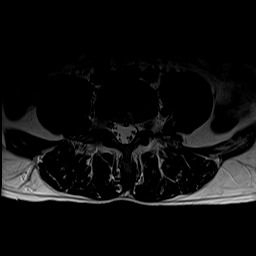
[im 20/43]
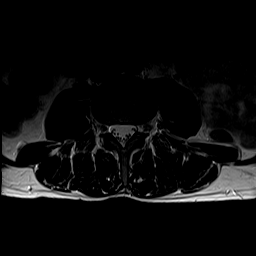
[im 23/43]
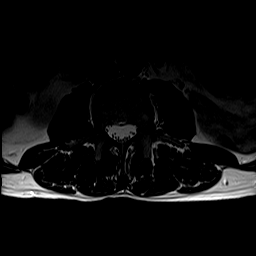
[im 26/43]
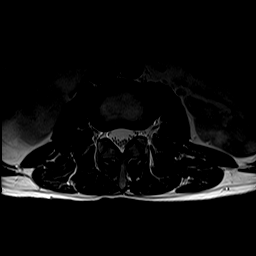
[im 31/43]
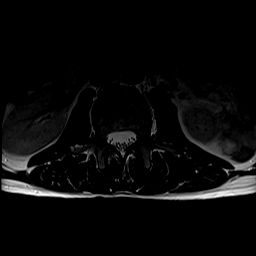
[im 37/43]
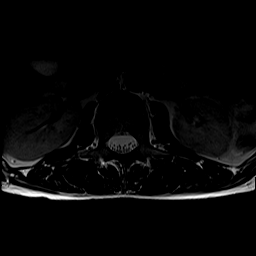
[im 43/43]
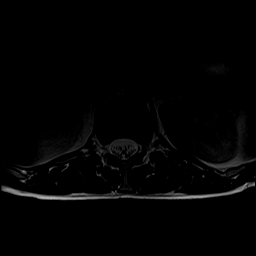

[Series 6: T1 · axial · 4.0mm · 0.39mm/px · z∈[-151,+77]mm · 10 of 43 slices shown (2 of 2)]
[im 3/43]
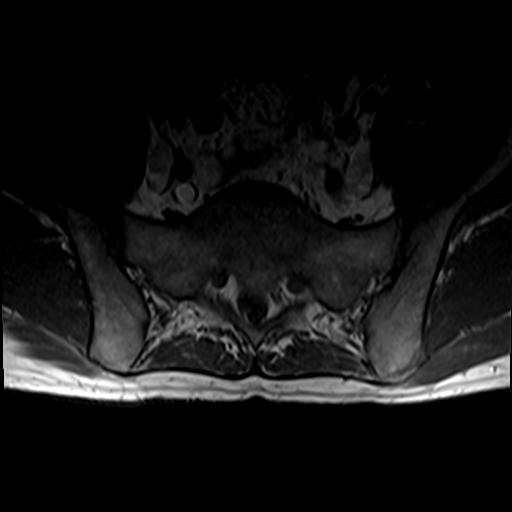
[im 6/43]
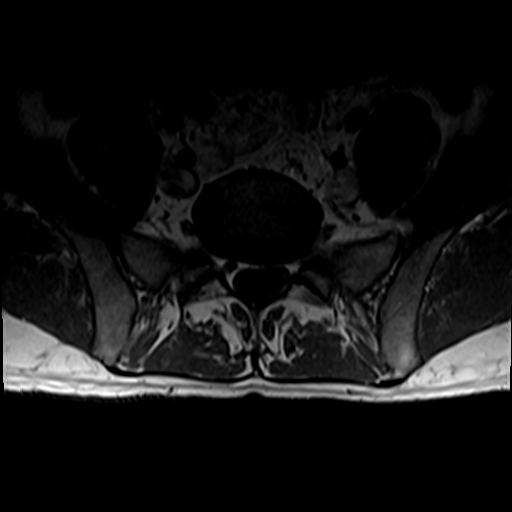
[im 9/43]
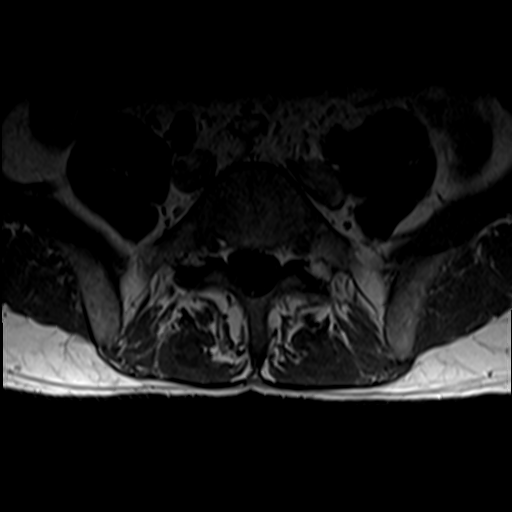
[im 15/43]
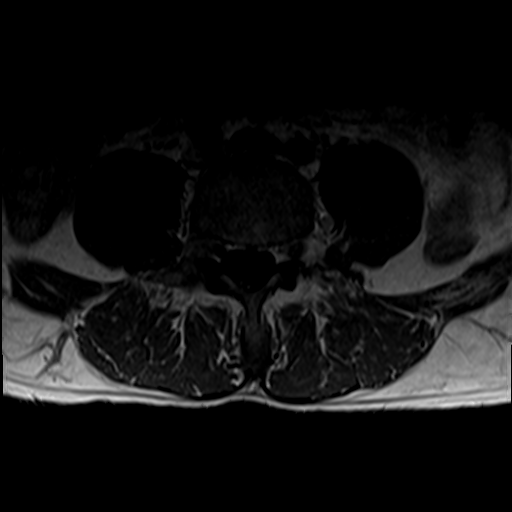
[im 20/43]
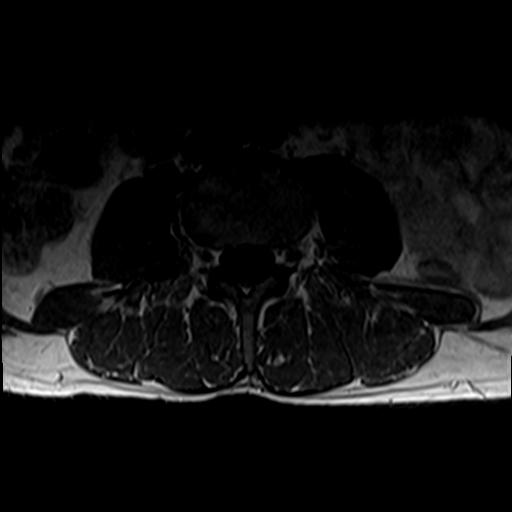
[im 23/43]
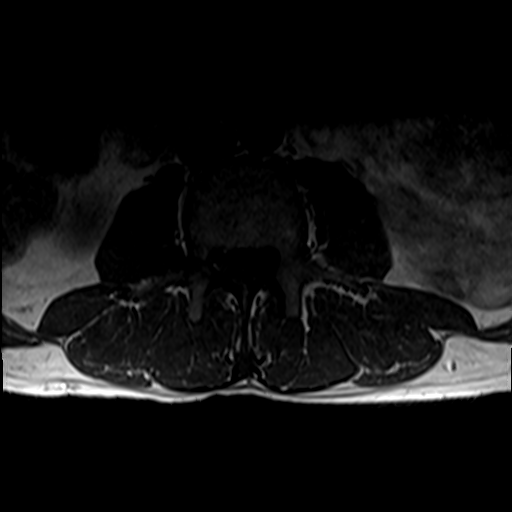
[im 26/43]
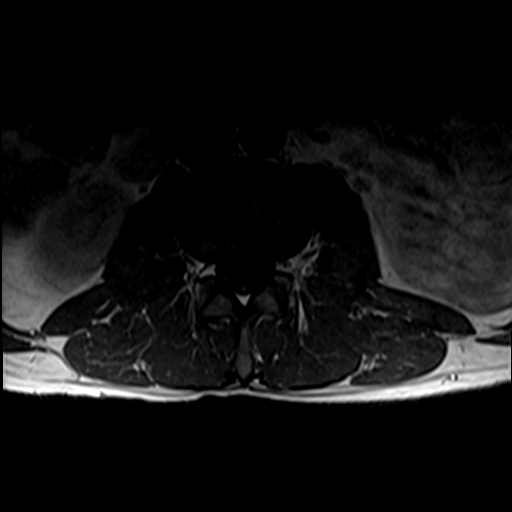
[im 31/43]
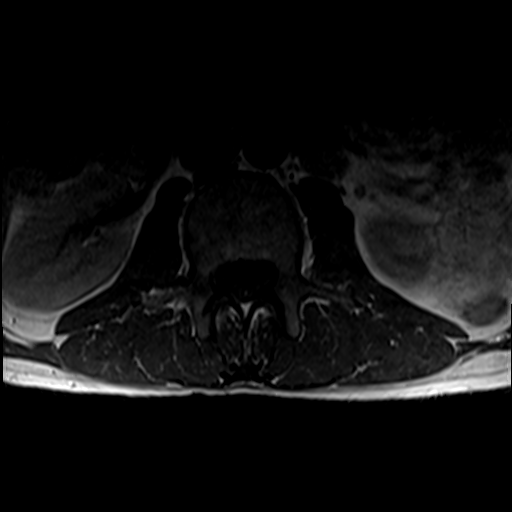
[im 37/43]
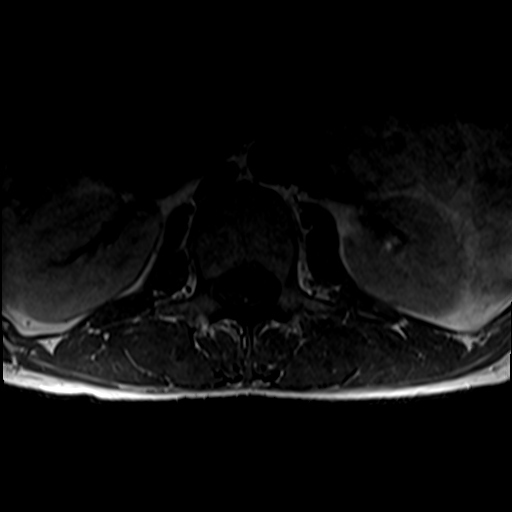
[im 43/43]
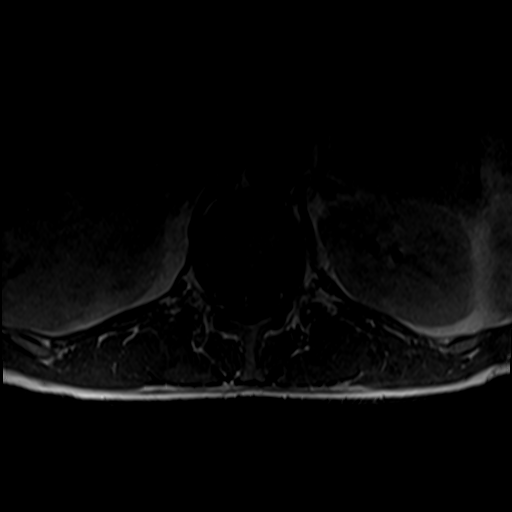

[35 of 48 positions shown; findings below may reference images not displayed]

FINDINGS: Segmentation: Conventional anatomy assumed, with the last open disc
space designated L5-S1.

Alignment:  Normal.

Vertebrae: No worrisome osseous lesion, acute fracture or pars
defect. The visualized sacroiliac joints appear unremarkable.

Conus medullaris: Extends to the T12 level and is not well
visualized.

Paraspinal and other soft tissues: No significant paraspinal
findings.

Disc levels:

No significant disc space findings from T12-L1 through L2-3.

L3-4: Mild disc desiccation and bulging asymmetric laterally to the
left. Mild bilateral facet hypertrophy. There is minimal narrowing
of the left lateral recess. The foramina are patent.

L4-5: Mild loss of disc height with annular disc bulging. Mild facet
and ligamentous hypertrophy. There is mild narrowing of the lateral
recesses. The foramina are patent.

L5-S1: Disc height and hydration are maintained. There is a shallow
left paracentral disc protrusion without resulting nerve root
encroachment. The foramina are patent.
IMPRESSION: 1. No high-grade spinal stenosis or definite nerve root
encroachment.
2. Mild disc bulging eccentric to the left at L3-4 with resulting
mild narrowing of the left lateral recess.
3. Mild narrowing of both lateral recesses at L4-5 due to disc
degeneration and annular bulging.
4. Shallow left paracentral disc protrusion at L5-S1 without nerve
root encroachment.

## 2021-11-14 DIAGNOSIS — M5416 Radiculopathy, lumbar region: Secondary | ICD-10-CM | POA: Diagnosis not present

## 2024-03-24 NOTE — Progress Notes (Signed)
 Subjective:    Patient ID: Nathaniel Chen is a 51yrs Male.  This is a 48 year old white male here for a routine check up  Pt is needing a Alk Phos and A1c  Lumbar radiculopathy - pt states that he doing a little better - but on a carnivore diet and has had some inflammation in joints   Elevated glucose - needs A1c today   Cigarette smoker -- not ready to quit   Elevated LDL  - doing well with out meds   Long term opiate usage - doing well on Hydrocodone last filled 9/2  BMI  19.68 lose 7 pounds in 3 1/2 months     The history is provided by the patient. No language interpreter was used.    Patient's allergies, medications, problem list, past medical, surgical, social and family histories were reviewed and updated as appropriate.  Review of Systems  HENT:  Negative for congestion, ear pain, rhinorrhea and sore throat.   Respiratory:  Negative for cough, chest tightness, shortness of breath and wheezing.   All other systems reviewed and are negative.    Objective:   BP 110/70 (BP Site: Right arm, Patient Position: Sitting, Cuff Size: Adult)   Pulse 67   Temp 96.9 F (36.1 C) (Temporal)   Ht 5' 8 (1.727 m)   Wt 129 lb 6.4 oz (58.7 kg)   SpO2 99%   BMI 19.68 kg/m  Physical Exam Vitals and nursing note reviewed.  Constitutional:      General: He is not in acute distress.    Appearance: Normal appearance. He is not ill-appearing.     Comments: Slightly underweight  HENT:     Head: Normocephalic and atraumatic.     Right Ear: External ear normal.     Left Ear: External ear normal.  Eyes:     General: No scleral icterus.       Right eye: No discharge.        Left eye: No discharge.     Extraocular Movements: Extraocular movements intact.     Conjunctiva/sclera: Conjunctivae normal.     Pupils: Pupils are equal, round, and reactive to light.  Neck:     Vascular: No carotid bruit.  Cardiovascular:     Rate and Rhythm: Normal rate and regular rhythm.      Pulses: Normal pulses.     Heart sounds: Normal heart sounds. No murmur heard.    No friction rub. No gallop.  Pulmonary:     Effort: Pulmonary effort is normal. No respiratory distress.     Breath sounds: Normal breath sounds. No stridor. No wheezing, rhonchi or rales.  Chest:     Chest wall: No tenderness.  Abdominal:     General: Abdomen is flat. Bowel sounds are normal. There is no distension.     Palpations: Abdomen is soft. There is no mass.     Tenderness: There is no abdominal tenderness. There is no right CVA tenderness, left CVA tenderness, guarding or rebound.     Hernia: No hernia is present.  Musculoskeletal:        General: Tenderness present. Normal range of motion.     Cervical back: Normal range of motion and neck supple. No rigidity or tenderness. No muscular tenderness.     Right lower leg: No edema.     Left lower leg: No edema.     Comments: Sacroiliac area tender bilaterally.  No radiating symptoms with spinal percussion.  Lymphadenopathy:  Cervical: No cervical adenopathy.  Skin:    General: Skin is warm and dry.     Capillary Refill: Capillary refill takes less than 2 seconds.     Findings: No lesion or rash.  Neurological:     General: No focal deficit present.     Mental Status: He is alert and oriented to person, place, and time. Mental status is at baseline.     Sensory: No sensory deficit.     Motor: No weakness.     Deep Tendon Reflexes: Reflexes normal.  Psychiatric:        Mood and Affect: Mood normal.        Behavior: Behavior normal.        Thought Content: Thought content normal.        Judgment: Judgment normal.        Assessment/Plan:       Diagnoses and all orders for this visit:  Lumbar radiculopathy, chronic Comments: Change ibuprofen to Aleve for less GI irritation.  Will not be due for refill on hydrocodone until early October.  Follow-up in 3 months. Orders: -     SED RATE (ESR); Future  Elevated  glucose Comments: Await results of today's A1c for further management. Orders: -     HEMOGLOBIN A1C (GLYCOSYLATED); Future  Cigarette smoker Comments: Harmful effects of smoking reviewed.  Encouraged to quit-try to cut back-if interested in quitting and needs help let us  know  High serum low-density lipoprotein (LDL) Comments: Will be due for fasting lipids on next visit.  Continue current regimen.  Long term current use of opiate analgesic Comments: Will not be due for hydrocodone refill until early October. Orders: -     ALKALINE PHOSPHATASE (ALP); Future  Tobacco abuse counseling Comments: 3 minutes spent detailing assistive aids when patient decides to quit-he is not yet ready but will call us  when he is.

## 2024-03-29 NOTE — Telephone Encounter (Signed)
-----   Message from Aliene SQUIBB Little sent at 03/28/2024  4:50 PM EDT ----- Alkaline phosphatase remains high at 190.  Patient will need alkaline phosphatase isoenzymes on his next visit.  A1c normal at 5.5 so he does not have diabetes.  Sed rate is normal at 3 so there is  no evidence of an inflammatory process.  Please reassure patient. ----- Message ----- From: Lab, Background User Sent: 03/24/2024  10:52 AM EDT To: Aliene SQUIBB Ly, MD

## 2024-03-29 NOTE — Telephone Encounter (Signed)
 Pt notified of labs

## 2024-04-24 ENCOUNTER — Emergency Department
Admission: EM | Admit: 2024-04-24 | Discharge: 2024-04-24 | Disposition: A | Discharge status: Home / Self Care | Payer: Self-pay | Attending: Emergency Medicine | Admitting: Emergency Medicine

## 2024-04-24 ENCOUNTER — Emergency Department: Payer: Self-pay

## 2024-04-24 ENCOUNTER — Other Ambulatory Visit: Payer: Self-pay

## 2024-04-24 DIAGNOSIS — M545 Low back pain, unspecified: Secondary | ICD-10-CM | POA: Insufficient documentation

## 2024-04-24 MED ORDER — PREDNISONE 10 MG PO TABS: ORAL_TABLET | ORAL | 0 refills | Status: AC

## 2024-04-24 MED ORDER — LIDOCAINE 5 % EX PTCH
1.0000 | MEDICATED_PATCH | Freq: Once | CUTANEOUS | Status: DC
  Administered 2024-04-24: 1 via TRANSDERMAL
  Filled 2024-04-24: qty 1

## 2024-04-24 NOTE — ED Provider Notes (Signed)
 [facility] Provider Note    Event Date/Time   First MD Initiated Contact with Patient 04/24/24 [phone]     (approximate)   History   Back Pain   HPI  Nathaniel Chen is a 48 y.o. male past medical history significant for chronic back pain who presents to the emergency department with worsening lower back pain.  Heavy lifting approximately 2 weeks ago and feels like he irritated his back.  Has been taking hydrocodone as prescribed by his primary care physician.  No prior history of back surgery.  States that he had an MRI approximately 5 years ago.  Denies any numbness or weakness that is ongoing in his extremities.  Denies any urinary or bowel incontinence.  No saddle anesthesia.  No falls or trauma.     Physical Exam   Triage Vital Signs: ED Triage Vitals  Encounter Vitals Group     BP 04/24/24 0847 126/66     Girls Systolic BP Percentile --      Girls Diastolic BP Percentile --      Boys Systolic BP Percentile --      Boys Diastolic BP Percentile --      Pulse Rate 04/24/24 0847 78     Resp 04/24/24 0847 18     Temp 04/24/24 0847 98.1 F (36.7 C)     Temp Source 04/24/24 0847 Oral     SpO2 04/24/24 0847 98 %     Weight 04/24/24 0905 139 lb 15.9 oz (63.5 kg)     Height 04/24/24 0905 5' 8 (1.727 m)     Head Circumference --      Peak Flow --      Pain Score 04/24/24 0848 5     Pain Loc --      Pain Education --      Exclude from Growth Chart --     Most recent vital signs: Vitals:   04/24/24 0847  BP: 126/66  Pulse: 78  Resp: 18  Temp: 98.1 F (36.7 C)  SpO2: 98%    Physical Exam Constitutional:      Appearance: He is well-developed.  HENT:     Head: Atraumatic.  Eyes:     Extraocular Movements: Extraocular movements intact.     Conjunctiva/sclera: Conjunctivae normal.     Pupils: Pupils are equal, round, and reactive to light.  Cardiovascular:     Rate and Rhythm: Regular rhythm.  Pulmonary:     Effort: No respiratory  distress.  Abdominal:     Tenderness: There is no abdominal tenderness. There is no right CVA tenderness or left CVA tenderness.  Musculoskeletal:        General: Normal range of motion.     Cervical back: Normal range of motion.     Comments: No midline thoracic or lumbar tenderness to palpation.   Skin:    General: Skin is warm.  Neurological:     Mental Status: He is alert. Mental status is at baseline.     Comments: Normal gait.  No saddle anesthesia.      IMPRESSION / MDM / ASSESSMENT AND PLAN / ED COURSE  I reviewed the triage vital signs and the nursing notes.  Differential diagnosis including sciatica, piriformis syndrome, disc herniation, radiculopathy.  No numbness or weakness on exam and no saddle anesthesia, no urinary or bowel incontinence.  No red flags and have a very low suspicion for cauda equina or epidural compression syndrome.  Do not feel that the  patient meets criteria for an emergent MRI at this time.   RADIOLOGY X-ray read as no acute findings Labs (all labs ordered are listed, but only abnormal results are displayed) Labs interpreted as -    Labs Reviewed - No data to display  Offered Toradol  and Tylenol however patient declined.  Will do a Lidoderm  patch.  Discussed outpatient follow-up with primary care provider.  Discussed return precautions to the emergency department for any worsening symptoms, weakness, urinary or bowel incontinence or saddle anesthesia.  Discussed a short course of steroids.  Discussed Lidoderm  patches and multimodal pain control.  No questions at time of discharge.  Discussed follow-up with primary care physician for acute on chronic back pain.    Patient's presentation is most consistent with acute complicated illness / injury requiring diagnostic workup.  PROCEDURES:  Critical Care performed: No  Procedures  Patient's presentation is most consistent with acute presentation with potential threat to life or bodily  function.   MEDICATIONS ORDERED IN ED: Medications  lidocaine  (LIDODERM ) 5 % 1 patch (has no administration in time range)    FINAL CLINICAL IMPRESSION(S) / ED DIAGNOSES   Final diagnoses:  Acute low back pain without sciatica, unspecified back pain laterality     Rx / DC Orders   ED Discharge Orders          Ordered    predniSONE  (DELTASONE ) 10 MG tablet  Q breakfast        04/24/24 1001             Note:  This document was prepared using Dragon voice recognition software and may include unintentional dictation errors.   Suzanne Kirsch, MD 04/24/24 1006

## 2024-04-24 NOTE — Discharge Instructions (Addendum)
 You are seen in the emergency department for back pain.  Your x-ray did not show any broken bones.  It is importantly follow-up closely with your primary care physician.  Continue to take your pain medication as prescribed.  You are given a course of steroids.  You can alternate Motrin and Tylenol for pain control.  You can use over-the-counter Lidoderm  patches which are 4% which you can get at [facility].  Leave on for 12 hours and then remove.  Follow-up closely with your primary care physician and return to the emergency department for any worsening symptoms.  Pain control:  Ibuprofen (motrin/aleve/advil) - You can take 3 tablets (600 mg) every 6 hours as needed for pain/fever.  Acetaminophen (tylenol) - You can take 2 extra strength tablets (1000 mg) every 6 hours as needed for pain/fever.  You can alternate these medications or take them together.  Make sure you eat food/drink water when taking these medications.  Prednisone  -you are given a prescription for a steroid.  It is important that you take this medication with food.  This medication can cause an upset stomach.  It also can increase your glucose if you have a history of diabetes, so it is important that you check your glucose frequently while you are on this medication.

## 2024-04-24 NOTE — ED Notes (Signed)
 See triage note  Presents with lower back pain  State she lifted something heavy  Denies any fell  States the pain is moving into both legs  Ambulates well

## 2024-04-24 NOTE — ED Triage Notes (Signed)
 Pt to ED via POV from home. Pt reports old back injury from early 1s that he irritated after lifting something heavy a week ago. No loss of bowel or bladder. Has radiation to bilateral legs intermittently.
